# Patient Record
Sex: Male | Born: 1960 | Race: Asian | Hispanic: No | Marital: Married | Smoking: Never smoker
Health system: Southern US, Community
[De-identification: ages and names within clinical notes are randomized; demographics above are authoritative.]

## PROBLEM LIST (undated history)

## (undated) DIAGNOSIS — E785 Hyperlipidemia, unspecified: Secondary | ICD-10-CM

## (undated) DIAGNOSIS — R7989 Other specified abnormal findings of blood chemistry: Secondary | ICD-10-CM

## (undated) DIAGNOSIS — T39395A Adverse effect of other nonsteroidal anti-inflammatory drugs [NSAID], initial encounter: Secondary | ICD-10-CM

## (undated) DIAGNOSIS — I10 Essential (primary) hypertension: Secondary | ICD-10-CM

## (undated) DIAGNOSIS — N200 Calculus of kidney: Secondary | ICD-10-CM

## (undated) HISTORY — DX: Essential (primary) hypertension: I10

## (undated) HISTORY — DX: Hyperlipidemia, unspecified: E78.5

## (undated) HISTORY — DX: Adverse effect of other nonsteroidal anti-inflammatory drugs (NSAID), initial encounter: T39.395A

## (undated) HISTORY — DX: Other specified abnormal findings of blood chemistry: R79.89

## (undated) HISTORY — DX: Calculus of kidney: N20.0

---

## 1999-09-04 ENCOUNTER — Encounter: Payer: Self-pay | Admitting: Emergency Medicine

## 1999-09-04 ENCOUNTER — Emergency Department (HOSPITAL_COMMUNITY): Admission: EM | Admit: 1999-09-04 | Discharge: 1999-09-04 | Payer: Self-pay | Admitting: Emergency Medicine

## 2000-03-05 ENCOUNTER — Encounter: Admission: RE | Admit: 2000-03-05 | Discharge: 2000-03-05 | Payer: Self-pay | Admitting: Urology

## 2000-03-05 ENCOUNTER — Encounter: Payer: Self-pay | Admitting: Urology

## 2000-03-11 ENCOUNTER — Encounter: Payer: Self-pay | Admitting: Urology

## 2000-03-11 ENCOUNTER — Encounter: Admission: RE | Admit: 2000-03-11 | Discharge: 2000-03-11 | Payer: Self-pay | Admitting: Urology

## 2000-03-19 ENCOUNTER — Encounter: Payer: Self-pay | Admitting: Urology

## 2000-03-19 ENCOUNTER — Encounter: Admission: RE | Admit: 2000-03-19 | Discharge: 2000-03-19 | Payer: Self-pay | Admitting: Urology

## 2000-11-13 ENCOUNTER — Encounter: Payer: Self-pay | Admitting: Family Medicine

## 2001-03-13 ENCOUNTER — Encounter: Payer: Self-pay | Admitting: Family Medicine

## 2001-03-13 LAB — CONVERTED CEMR LAB: Hgb A1c MFr Bld: 8 %

## 2001-09-13 ENCOUNTER — Encounter: Payer: Self-pay | Admitting: Family Medicine

## 2002-04-14 ENCOUNTER — Encounter: Payer: Self-pay | Admitting: Family Medicine

## 2002-09-14 ENCOUNTER — Encounter: Payer: Self-pay | Admitting: Family Medicine

## 2002-09-14 LAB — CONVERTED CEMR LAB: Hgb A1c MFr Bld: 7.5 %

## 2002-12-14 ENCOUNTER — Encounter: Payer: Self-pay | Admitting: Family Medicine

## 2002-12-14 LAB — CONVERTED CEMR LAB: Hgb A1c MFr Bld: 5.8 %

## 2003-08-14 ENCOUNTER — Encounter: Payer: Self-pay | Admitting: Family Medicine

## 2004-03-13 ENCOUNTER — Encounter: Payer: Self-pay | Admitting: Family Medicine

## 2004-03-13 LAB — CONVERTED CEMR LAB: Hgb A1c MFr Bld: 8.3 %

## 2004-04-01 ENCOUNTER — Ambulatory Visit: Payer: Self-pay | Admitting: Family Medicine

## 2004-04-03 ENCOUNTER — Ambulatory Visit: Payer: Self-pay | Admitting: Family Medicine

## 2004-04-10 ENCOUNTER — Emergency Department (HOSPITAL_COMMUNITY): Admission: EM | Admit: 2004-04-10 | Discharge: 2004-04-10 | Payer: Self-pay | Admitting: Emergency Medicine

## 2004-10-07 ENCOUNTER — Ambulatory Visit: Payer: Self-pay | Admitting: Family Medicine

## 2004-12-13 ENCOUNTER — Encounter: Payer: Self-pay | Admitting: Family Medicine

## 2004-12-13 LAB — CONVERTED CEMR LAB
Hgb A1c MFr Bld: 8.2 %
Microalbumin U total vol: 2.2 mg/L

## 2004-12-31 ENCOUNTER — Ambulatory Visit: Payer: Self-pay | Admitting: Family Medicine

## 2004-12-31 LAB — CONVERTED CEMR LAB: Microalbumin U total vol: 2.2 mg/L

## 2005-01-02 ENCOUNTER — Ambulatory Visit: Payer: Self-pay | Admitting: Family Medicine

## 2006-04-15 ENCOUNTER — Ambulatory Visit: Payer: Self-pay | Admitting: Family Medicine

## 2006-08-03 ENCOUNTER — Ambulatory Visit: Payer: Self-pay | Admitting: Family Medicine

## 2006-08-04 DIAGNOSIS — I10 Essential (primary) hypertension: Secondary | ICD-10-CM

## 2006-08-04 DIAGNOSIS — M109 Gout, unspecified: Secondary | ICD-10-CM | POA: Insufficient documentation

## 2006-08-04 DIAGNOSIS — E1149 Type 2 diabetes mellitus with other diabetic neurological complication: Secondary | ICD-10-CM

## 2006-08-05 ENCOUNTER — Ambulatory Visit: Payer: Self-pay | Admitting: Family Medicine

## 2006-08-05 DIAGNOSIS — R1013 Epigastric pain: Secondary | ICD-10-CM

## 2006-08-05 LAB — CONVERTED CEMR LAB
BUN: 9 mg/dL (ref 6–23)
Bilirubin, Direct: 0.1 mg/dL (ref 0.0–0.3)
CO2: 29 meq/L (ref 19–32)
Creatinine,U: 240.4 mg/dL
GFR calc Af Amer: 94 mL/min
Glucose, Bld: 211 mg/dL — ABNORMAL HIGH (ref 70–99)
Microalb Creat Ratio: 7.1 mg/g (ref 0.0–30.0)
Microalb, Ur: 1.7 mg/dL (ref 0.0–1.9)
Potassium: 4.4 meq/L (ref 3.5–5.1)
Sodium: 143 meq/L (ref 135–145)
Total CHOL/HDL Ratio: 5
Total Protein: 7.3 g/dL (ref 6.0–8.3)
Triglycerides: 197 mg/dL — ABNORMAL HIGH (ref 0–149)

## 2006-08-21 ENCOUNTER — Ambulatory Visit: Payer: Self-pay | Admitting: Family Medicine

## 2006-08-21 LAB — CONVERTED CEMR LAB
Ketones, urine, test strip: NEGATIVE
Nitrite: NEGATIVE

## 2006-09-08 ENCOUNTER — Ambulatory Visit: Payer: Self-pay | Admitting: Gastroenterology

## 2006-09-08 LAB — CONVERTED CEMR LAB
Basophils Absolute: 0 10*3/uL (ref 0.0–0.1)
Eosinophils Absolute: 0.3 10*3/uL (ref 0.0–0.6)
Eosinophils Relative: 4.4 % (ref 0.0–5.0)
HCT: 35.6 % — ABNORMAL LOW (ref 39.0–52.0)
Hemoglobin: 12.3 g/dL — ABNORMAL LOW (ref 13.0–17.0)
MCHC: 34.5 g/dL (ref 30.0–36.0)
MCV: 85 fL (ref 78.0–100.0)
Monocytes Absolute: 0.4 10*3/uL (ref 0.2–0.7)
Neutrophils Relative %: 59.4 % (ref 43.0–77.0)
RDW: 12 % (ref 11.5–14.6)
WBC: 6.8 10*3/uL (ref 4.5–10.5)

## 2006-09-18 ENCOUNTER — Ambulatory Visit: Payer: Self-pay | Admitting: Gastroenterology

## 2006-09-18 ENCOUNTER — Encounter: Payer: Self-pay | Admitting: Family Medicine

## 2006-10-27 ENCOUNTER — Ambulatory Visit: Payer: Self-pay | Admitting: Gastroenterology

## 2006-10-27 LAB — CONVERTED CEMR LAB
Basophils Absolute: 0 10*3/uL (ref 0.0–0.1)
Basophils Relative: 0.3 % (ref 0.0–1.0)
Lymphocytes Relative: 28 % (ref 12.0–46.0)
MCHC: 34.6 g/dL (ref 30.0–36.0)
Monocytes Relative: 7.1 % (ref 3.0–11.0)
Neutro Abs: 4.1 10*3/uL (ref 1.4–7.7)
Platelets: 288 10*3/uL (ref 150–400)

## 2007-03-18 DIAGNOSIS — K219 Gastro-esophageal reflux disease without esophagitis: Secondary | ICD-10-CM | POA: Insufficient documentation

## 2007-12-23 ENCOUNTER — Ambulatory Visit: Payer: Self-pay | Admitting: Family Medicine

## 2009-03-12 ENCOUNTER — Telehealth (INDEPENDENT_AMBULATORY_CARE_PROVIDER_SITE_OTHER): Payer: Self-pay | Admitting: *Deleted

## 2009-03-16 ENCOUNTER — Ambulatory Visit: Payer: Self-pay | Admitting: Family Medicine

## 2009-03-16 LAB — CONVERTED CEMR LAB
AST: 33 units/L (ref 0–37)
Albumin: 3.7 g/dL (ref 3.5–5.2)
BUN: 10 mg/dL (ref 6–23)
Basophils Absolute: 0 10*3/uL (ref 0.0–0.1)
CO2: 30 meq/L (ref 19–32)
Calcium: 9.4 mg/dL (ref 8.4–10.5)
Chloride: 103 meq/L (ref 96–112)
Cholesterol: 169 mg/dL (ref 0–200)
Creatinine, Ser: 1.2 mg/dL (ref 0.4–1.5)
HCT: 43.5 % (ref 39.0–52.0)
Hemoglobin: 14.8 g/dL (ref 13.0–17.0)
Hgb A1c MFr Bld: 12.6 % — ABNORMAL HIGH (ref 4.6–6.5)
Lymphs Abs: 2.5 10*3/uL (ref 0.7–4.0)
MCHC: 33.9 g/dL (ref 30.0–36.0)
MCV: 88.6 fL (ref 78.0–100.0)
Microalb Creat Ratio: 94.7 mg/g — ABNORMAL HIGH (ref 0.0–30.0)
Microalb, Ur: 0.9 mg/dL (ref 0.0–1.9)
Monocytes Absolute: 0.6 10*3/uL (ref 0.1–1.0)
Neutro Abs: 3.7 10*3/uL (ref 1.4–7.7)
Platelets: 237 10*3/uL (ref 150.0–400.0)
RDW: 11.9 % (ref 11.5–14.6)
TSH: 2.36 microintl units/mL (ref 0.35–5.50)
Total Bilirubin: 0.6 mg/dL (ref 0.3–1.2)
Total CHOL/HDL Ratio: 4
Triglycerides: 383 mg/dL — ABNORMAL HIGH (ref 0.0–149.0)
Uric Acid, Serum: 8.1 mg/dL — ABNORMAL HIGH (ref 4.0–7.8)

## 2009-03-21 ENCOUNTER — Ambulatory Visit: Payer: Self-pay | Admitting: Family Medicine

## 2009-04-18 ENCOUNTER — Encounter (INDEPENDENT_AMBULATORY_CARE_PROVIDER_SITE_OTHER): Payer: Self-pay | Admitting: *Deleted

## 2009-04-18 ENCOUNTER — Ambulatory Visit: Payer: Self-pay | Admitting: Gastroenterology

## 2009-04-18 DIAGNOSIS — R197 Diarrhea, unspecified: Secondary | ICD-10-CM

## 2009-04-25 ENCOUNTER — Ambulatory Visit: Payer: Self-pay | Admitting: Gastroenterology

## 2009-04-25 LAB — CONVERTED CEMR LAB
ALT: 34 units/L (ref 0–53)
Albumin: 3.4 g/dL — ABNORMAL LOW (ref 3.5–5.2)
Alkaline Phosphatase: 46 units/L (ref 39–117)
Bilirubin, Direct: 0.1 mg/dL (ref 0.0–0.3)
Total Protein: 7.2 g/dL (ref 6.0–8.3)

## 2009-04-25 LAB — HM COLONOSCOPY: HM Colonoscopy: NORMAL

## 2009-04-30 ENCOUNTER — Encounter: Payer: Self-pay | Admitting: Gastroenterology

## 2009-06-27 ENCOUNTER — Ambulatory Visit: Payer: Self-pay | Admitting: Family Medicine

## 2009-06-27 LAB — CONVERTED CEMR LAB
AST: 24 units/L (ref 0–37)
Hgb A1c MFr Bld: 10.3 % — ABNORMAL HIGH (ref 4.6–6.5)

## 2009-07-04 ENCOUNTER — Ambulatory Visit: Payer: Self-pay | Admitting: Family Medicine

## 2009-08-20 ENCOUNTER — Encounter (INDEPENDENT_AMBULATORY_CARE_PROVIDER_SITE_OTHER): Payer: Self-pay | Admitting: *Deleted

## 2010-02-14 NOTE — Letter (Signed)
Summary: Diabetic Instructions  [facility]  [address] [place], [place] [address]   Phone: [phone]  Fax: [phone]    Patrick Watts February 12, 1960 MRN: [id number]   X   ORAL DIABETIC MEDICATION INSTRUCTIONS  The day before your procedure:   Take your diabetic pill as you do normally  The day of your procedure:   Do not take your diabetic pill    We will check your blood sugar levels during the admission process and again in Recovery before discharging you home  ________________________________________________________________________

## 2010-02-14 NOTE — Progress Notes (Signed)
Summary: needs refill on lisinopril   Phone Note Call from Patient Call back at [phone]   Caller: Patient Call For: Shaune Leeks MD Summary of Call: Patient called requesting refill on Lisinopril. He needs office visit before getting another refill. Scheduled app for 03-21-09. He wants to know if he can get refill to get him through until then sent to [facility] on [address].  Initial call taken by: Melody Comas,  March 12, 2009 2:04 PM    Prescriptions: LISINOPRIL 20 MG TABS (LISINOPRIL) Take 1 tablet by mouth once a day  #30 Each x 0   Entered and Authorized by:   Shaune Leeks MD   Signed by:   Shaune Leeks MD on 03/12/2009   Method used:   Electronically to        [facility] (retail)       [address]       [place], [place]  [address]       Ph: [phone]       Fax: [phone]   RxID:   [id number]

## 2010-02-14 NOTE — Letter (Signed)
Summary: Results Letter  [facility]  [address] [place], [place] [address]   Phone: [phone]  Fax: [phone]        April 30, 2009 MRN: [id number]    MYLEZ VENABLE [address] [place], [place]  [address]    Dear Mr. ZERINGUE,   The biopsies taken during your recent colonoscopy were all normal.  You should continue to follow the recommnedations that we discussed at the time of your procedure.  You should continue to follow current colorectal cancer screening guidelines with a repeat colonoscopy in 10 years.  We will therefore put your information in our reminder system and will contact you in 10 years to schedule a repeat procedure.  Please call if you have any questions or concerns.       Sincerely,  Rachael Fee MD  This letter has been electronically signed by your physician.  Appended Document: Results Letter letter mailed 4.20.11.

## 2010-02-14 NOTE — Assessment & Plan Note (Signed)
Summary: CPX/ AET[employer]  CYD   Vital Signs:  Patient profile:   50 year old male Height:      67.5 inches Weight:      207.50 pounds BMI:     32.14 Temp:     98.8 degrees F oral Pulse rate:   84 / minute Pulse rhythm:   regular BP sitting:   118 / 80  (left arm) Cuff size:   regular  Vitals Entered By: Sydell Axon LPN (March 21, 1476 10:56 AM) CC: 30 Minute checkup   History of Present Illness: Pt last seen 12/09, now here for Comp Exam. He has stomach problems occas, 2-3 days out of the weeks. He had been seen in the past by Dr Christella Hartigan and was told to use Prilosec at night. He has lots of complaints and changes in BMs...he requests reeval, so will send back to GI. He is also tired, not every day...Marland Kitchenhe also doesn't have any these above sxs every day, they really wax and wane. He does have days when things are nml.  Preventive Screening-Counseling & Management  Alcohol-Tobacco     Alcohol drinks/day: 0     Smoking Status: never     Passive Smoke Exposure: no  Caffeine-Diet-Exercise     Caffeine use/day: 1 tea a day.     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 Take 1 Tablet By Mouth Once A Day 2)  Metformin Hcl 850 Mg  Tabs (Metformin Hcl) .... Take One By Mouth Three Times A Day 3)  Glimepiride 4 Mg  Tabs (Glimepiride) .... Take One By Mouth  Twice A Day. 4)  Pravachol 40 Mg  Tabs (Pravastatin Sodium) .... 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 (03/21/2009) Exercise: yes (03/21/2009)  Risk Factors: Smoking Status: never (03/21/2009) Passive Smoke Exposure: no (03/21/2009)  Family History: Father: dec 77 Renal Failure CHF coronary disease, CABG x 3 Mother: dec  diabetes and hypertension, kidney dz, CHF Brother A 98 DM  Sister  A 50Kidney stones Sister A 96  Social History: Caffeine use/day:  1 tea a day. Does Patient Exercise:  yes  Review of Systems General:  Complains of sweats; denies chills, fatigue, fever, weakness, and weight loss; at night.Marland Kitchen  Eyes:  Denies blurring, discharge, and eye pain. ENT:  Denies decreased hearing, ear discharge, earache, and ringing in ears. CV:  Denies chest pain or discomfort, fainting, fatigue, palpitations, shortness of breath with exertion, swelling of feet, and swelling of hands. Resp:  Denies cough, shortness of breath, and wheezing. GI:  Complains of abdominal pain, bloody stools, and diarrhea; denies change in bowel habits, constipation, dark tarry stools, indigestion, loss of appetite, nausea, vomiting, vomiting blood, and yellowish skin color; off and on, see HPI. GU:  Denies discharge, dysuria, nocturia, and urinary frequency. MS:  Denies joint pain, low back pain, muscle aches, cramps, and  stiffness. Derm:  Denies dryness, itching, and rash. Neuro:  Denies numbness, poor balance, tingling, and tremors.  Physical Exam  General:  Well-developed,well-nourished,in no acute distress; alert,appropriate and cooperative throughout examination, mildly obese. Head:  Normocephalic and atraumatic without obvious abnormalities. No apparent alopecia or balding. Eyes:  Conjunctiva clear bilaterally.  Ears:  External ear exam shows no significant lesions or deformities.  Otoscopic examination reveals clear canals, tympanic membranes are intact bilaterally without bulging, retraction, inflammation or discharge. Hearing is grossly normal bilaterally. Nose:  External nasal examination shows no deformity or inflammation. Nasal mucosa are pink and moist without lesions or exudates. Mouth:  Oral mucosa and oropharynx without lesions or exudates.  Teeth in good repair. Neck:  No deformities, masses, or tenderness noted. Chest Wall:  No deformities, masses, tenderness or gynecomastia noted. Breasts:  No masses or gynecomastia noted Lungs:  Normal respiratory effort, chest expands symmetrically. Lungs are clear to auscultation, no crackles or wheezes. Heart:  Normal rate and regular rhythm. S1 and S2 normal without gallop, murmur, click, rub or other extra sounds. Abdomen:  Bowel sounds positive,abdomen soft and non-tender without masses, organomegaly or hernias noted. No pathology noted today. Rectal:  No external abnormalities noted. Normal sphincter tone. No rectal masses or tenderness. G neg. Genitalia:  Testes bilaterally descended without nodularity, tenderness or masses. No scrotal masses or lesions. No penis lesions or urethral discharge. No tenderness to palpation in the right testicular or spermatic chord area. no hernia noted. Prostate:  Prostate gland firm and smooth, no enlargement, nodularity, tenderness, mass, asymmetry or induration.30-40gms Msk:  No deformity or scoliosis noted of  thoracic or lumbar spine.   Pulses:  R and L carotid,radial,femoral,dorsalis pedis and posterior tibial pulses are full and equal bilaterally Extremities:  No clubbing, cyanosis, edema, or deformity noted with normal full range of motion of all joints.   Neurologic:  No cranial nerve deficits noted. Station and gait are normal. Sensory, motor and coordinative functions appear intact. Skin:  Intact without suspicious lesions or rashes Cervical Nodes:  No lymphadenopathy noted Inguinal Nodes:  No significant adenopathy Psych:  Cognition and judgment appear intact. Alert and cooperative with normal attention span and concentration. No apparent delusions, illusions, hallucinations  Diabetes Management Exam:    Foot Exam (with socks and/or shoes not present):       Sensory-Pinprick/Light touch:          Left medial foot (L-4): normal          Left dorsal foot (L-5): normal          Left lateral foot (S-1): normal          Right medial foot (L-4): normal          Right dorsal foot (L-5): normal          Right lateral foot (S-1): normal  Sensory-Monofilament:          Left foot: normal          Right foot: normal       Inspection:          Left foot: normal          Right foot: normal       Nails:          Left foot: normal          Right foot: normal    Impression & Recommendations:  Problem # 1:  HEALTH MAINTENANCE EXAM (ICD-V70.0) Assessment Comment Only  Problem # 2:  GERD (ICD-530.81) Assessment: Deteriorated  ?Gastroparesis? Will refer back to GI for eval. Orders: Gastroenterology Referral (GI)  Problem # 3:  SYMPTOM, PAIN, ABDOMINAL, EPIGASTRIC (ICD-789.06) Assessment: Deteriorated See above. Start Citrucel to try to regulate GI tract better.  Problem # 4:  RENAL CALCULUS, RECURRENT 08/01 (ICD-592.0) Assessment: Unchanged Stable, none recently.  Problem # 5:  AODM (ICD-250.00) Assessment: Deteriorated Poor control, readily admits to complete noncompliance.  Will intensify medications with starting Actos. 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 Merely need to institute it! Could be reason for abd pains through diab gastroparesis. Discussed at length. His updated medication list for this problem includes:    Lisinopril 20 Mg Tabs (Lisinopril) .Marland Kitchen... Take 1 tablet by mouth once a day    Metformin Hcl 850 Mg Tabs (Metformin hcl) .Marland Kitchen... Take one by mouth three times a day    Glimepiride 4 Mg Tabs (Glimepiride) .Marland Kitchen... Take one by mouth twice a day.    Actos 45 Mg Tabs (Pioglitazone hcl) ..... One tab by mouth once daily  Labs Reviewed: Creat: 1.2 (03/16/2009)   Microalbumin: 2.2 (12/31/2004) Reviewed HgBA1c results: 12.6 (03/16/2009)  9.9 (08/03/2006)  Problem # 6:  GOUT NOS (ICD-274.9) Assessment: Unchanged  Uses Colchicine routinely. Does not want to start Allopurinol. Will send  in script.  His updated medication list for this problem includes:    Colcrys 0.6 Mg Tabs (Colchicine) ..... One tab by mouth two times a day as needed gouty symptoms as needed.  Problem # 7:  HYPERLIPIDEMIA TRIG NEC/NOS (ICD-272.4) Assessment: Improved Adequate. Cont meds and watch fatty food intake. His updated medication list for this problem includes:    Pravachol 40 Mg Tabs (Pravastatin sodium) .Marland Kitchen... Take 2 by mouth at bedtime  Labs Reviewed: SGOT: 33 (03/16/2009)   SGPT: 32 (03/16/2009)   HDL:44.70 (03/16/2009), 30.6 (08/03/2006)  LDL:83 (08/03/2006)  Chol:169 (03/16/2009), 153 (08/03/2006)  Trig:383.0 (03/16/2009), 197 (08/03/2006)  Problem # 8:  HYPERTENSION, BENIGN ESSENTIAL (ICD-401.1) Assessment: Improved Stable with good control. Cont curr regimen. His updated medication list for this problem includes:    Lisinopril 20 Mg Tabs (Lisinopril) .Marland Kitchen... Take 1 tablet by mouth once a day  BP today: 118/80 Prior BP: 130/80 (12/23/2007)  Labs Reviewed: K+: 4.5 (03/16/2009) Creat: : 1.2 (03/16/2009)   Chol: 169 (03/16/2009)   HDL: 44.70  (03/16/2009)   LDL: 83 (08/03/2006)   TG: 383.0 (03/16/2009)  Complete Medication List: 1)  Lisinopril 20 Mg Tabs (Lisinopril) .... Take 1 tablet by mouth once a day 2)  Metformin Hcl 850 Mg Tabs (Metformin hcl) .... Take one by mouth three times a day 3)  Glimepiride 4 Mg Tabs (Glimepiride) .... Take one by mouth twice a day. 4)  Pravachol 40 Mg Tabs (Pravastatin sodium) .... Take 2 by mouth at bedtime 5)  Actos 45 Mg Tabs (Pioglitazone hcl) .... One tab  by mouth once daily 6)  Colcrys 0.6 Mg Tabs (Colchicine) .... One tab by mouth two times a day as needed gouty symptoms as needed.  Patient Instructions: 1)  Refer to GI. 2)  RTC for SGOT, SGPT 6 weeks, again in 3 mos with A1C 250.00, see me then. 3)  Switch Prilosec to 45 mins before brfst.  4)  Start on Citrucel 1 tsp in 8 oz of water every AM.  Prescriptions: COLCRYS 0.6 MG TABS (COLCHICINE) one tab by mouth two times a day as needed gouty symptoms as needed.  #60 x 6   Entered and Authorized by:   Shaune Leeks MD   Signed by:   Shaune Leeks MD on 03/22/2009   Method used:   Electronically to        [facility] (retail)       [address]       [place], [place]  [address]       Ph: [phone]       Fax: [phone]   RxID:   [id number] PRAVACHOL 40 MG  TABS (PRAVASTATIN SODIUM) Take 2 by mouth at bedtime  #60 x 12   Entered and Authorized by:   Shaune Leeks MD   Signed by:   Shaune Leeks MD on 03/21/2009   Method used:   Electronically to        [facility] (retail)       [address]       [place], [place]  [address]       Ph: [phone]       Fax: [phone]   RxID:   [id number] GLIMEPIRIDE 4 MG  TABS (GLIMEPIRIDE) Take one by mouth twice a day.  #60 Each x 12   Entered and Authorized by:   Shaune Leeks MD   Signed by:   Shaune Leeks MD on 03/21/2009   Method used:   Electronically to         [facility] (retail)       [address]       [place], [place]  [address]       Ph: [phone]       Fax: [phone]   RxID:   [id number] METFORMIN HCL 850 MG  TABS (METFORMIN HCL) Take one by mouth three times a day  #90 x 12   Entered and Authorized by:   Shaune Leeks MD   Signed by:   Shaune Leeks MD on 03/21/2009   Method used:   Electronically to        [facility] (retail)       [address]       [place], [place]  [address]       Ph: [phone]       Fax: [phone]   RxID:   [id number] LISINOPRIL 20 MG TABS (LISINOPRIL) Take 1 tablet by mouth once a day  #30 Each x 12   Entered and Authorized by:   Shaune Leeks MD   Signed by:   Shaune Leeks MD on 03/21/2009   Method used:   Electronically to        [facility] (retail)  [address]       [place], [place]  [address]       Ph: [phone]       Fax: [phone]   RxID:   [id number] ACTOS 45 MG TABS (PIOGLITAZONE HCL) one tab by mouth once daily  #30 x 12   Entered and Authorized by:   Shaune Leeks MD   Signed by:   Shaune Leeks MD on 03/21/2009   Method used:   Electronically to        [facility] (retail)       [address]       [place], [place]  [address]       Ph: [phone]       Fax: [phone]   RxID:   [id number]   Current Allergies (reviewed today): No known allergies    Influenza Immunization History:    Influenza # 1:  Historical (01/19/2009) Received vaccine per patient at work

## 2010-02-14 NOTE — Assessment & Plan Note (Signed)
Summary: 3 m f/u dlo   Vital Signs:  Patient profile:   50 year old male Weight:      223.12 pounds Temp:     98.4 degrees F oral Pulse rate:   76 / minute Pulse rhythm:   regular BP sitting:   130 / 98  (left arm) Cuff size:   large  Vitals Entered By: Sydell Axon LPN (July 04, 2009 7:59 AM) CC: 3 Month follow-up, ? poison ivy on arms   History of Present Illness: Pt here for recheck. He has been on Actois 45 since last visit. He had one dsay of ankle swelling and has not had another episode since. He also has had increased hunger which is still going on. This has improved but continues today. Otherwise tolerating Actos well. He has had colonoscopy and feels much better.  He has rash on his medial forearms thst he thinks is poison ivy...has not extended but bothers him.  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 Take 1 Tablet By Mouth Once A Day 2)  Metformin Hcl 850 Mg  Tabs (Metformin Hcl) .... Take One By Mouth Three Times A Day 3)  Glimepiride 4 Mg  Tabs (Glimepiride) .... Take One By Mouth Twice A Day. 4)  Pravachol 40 Mg  Tabs (Pravastatin Sodium) .... Take 2 By Mouth At Bedtime 5)  Actos 45 Mg Tabs (Pioglitazone Hcl) .... One Tab By Mouth Once Daily 6)  Colcrys 0.6 Mg Tabs (Colchicine) .... One Tab By Mouth Two Times A Day As Needed Gouty Symptoms As Needed. 7)  Prilosec Otc 20 Mg Tbec (Omeprazole Magnesium) .... Take One By Mouth Twice Daily 8)  Pepcid 20 Mg Tabs (Famotidine) .... 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 (03/21/2009) Exercise: yes (03/21/2009)  Risk Factors: Smoking Status: never (03/21/2009) Passive Smoke Exposure: no (03/21/2009)  Physical Exam  General:  Well-developed,well-nourished,in no acute distress; alert,appropriate and cooperative throughout examination, mildly obese. Head:  Normocephalic and atraumatic without obvious abnormalities. No apparent alopecia or balding. Eyes:  Conjunctiva clear bilaterally.  Ears:  External ear exam shows no significant lesions or deformities.  Otoscopic examination reveals clear canals, tympanic membranes are intact bilaterally without bulging, retraction, inflammation or discharge. Hearing is grossly normal bilaterally. Nose:  External nasal examination shows no deformity or inflammation. Nasal mucosa are pink and moist without lesions or exudates. Mouth:  Oral  mucosa and oropharynx without lesions or exudates.  Teeth in good repair. Neck:  No deformities, masses, or tenderness noted. Chest Wall:  No deformities, masses, tenderness or gynecomastia noted. Lungs:  Normal respiratory effort, chest expands symmetrically. Lungs are clear to auscultation, no crackles or wheezes. Heart:  Normal rate and regular rhythm. S1 and S2 normal without gallop, murmur, click, rub or other extra  sounds.   Impression & Recommendations:  Problem # 1:  DIARRHEA, ACUTE (ICD-787.91) Assessment Improved His prep is probably more the reason for his improved sxs than anything else. May need occas "cleanout " of his bowels to preserve his bowel health. Discussed symptom control and diet. Call if worsening of symptoms or signs of dehydration.   Problem # 2:  Family Hx of POLYP, COLON (ICD-211.3) Assessment: Unchanged Had no evidence of polyps himself with colonoscopy. Will be rescoped in ten years.  Problem # 3:  SYMPTOM, PAIN, ABDOMINAL, EPIGASTRIC (ICD-789.06) Assessment: Improved Resolved.  Problem # 4:  AODM (ICD-250.00) Assessment: Improved  Better but still a fair ways to go. Pt working hard at controlling appetite. Acvtos is still continuing to kick in in my opinion. Continue curr therapy. He was given printout of A1Cs as motivation. His updated medication list for this problem includes:    Lisinopril 20 Mg Tabs (Lisinopril) .Marland Kitchen... Take 1 tablet by mouth once a day    Metformin Hcl 850 Mg Tabs (Metformin hcl) .Marland Kitchen... Take one by mouth three times a day    Glimepiride 4 Mg Tabs (Glimepiride) .Marland Kitchen... Take one by mouth twice a day.    Actos 45 Mg Tabs (Pioglitazone hcl) ..... One tab by mouth once daily  Labs Reviewed: Creat: 1.2 (03/16/2009)   Microalbumin: 2.2 (12/31/2004) Reviewed HgBA1c results: 10.3 (06/27/2009)  12.6 (03/16/2009)  Problem # 5:  HYPERTENSION, BENIGN ESSENTIAL (ICD-401.1) Assessment: Unchanged Probably nerves today. Pressures have been great in the recent past. Recheck in the future. He has the ability to get rechecked at work.  His updated medication list for this problem includes:    Lisinopril 20 Mg Tabs (Lisinopril) .Marland Kitchen... Take 1 tablet by mouth once a day  BP today: 130/98 Prior BP: 126/78 (04/18/2009)  Labs Reviewed: K+: 4.5 (03/16/2009) Creat: : 1.2 (03/16/2009)   Chol: 169 (03/16/2009)   HDL: 44.70 (03/16/2009)   LDL: 83 (08/03/2006)   TG: 383.0  (03/16/2009)  Complete Medication List: 1)  Lisinopril 20 Mg Tabs (Lisinopril) .... Take 1 tablet by mouth once a day 2)  Metformin Hcl 850 Mg Tabs (Metformin hcl) .... Take one by mouth three times a day 3)  Glimepiride 4 Mg Tabs (Glimepiride) .... Take one by mouth twice a day. 4)  Pravachol 40 Mg Tabs (Pravastatin sodium) .... Take 2 by mouth at bedtime 5)  Actos 45 Mg Tabs (Pioglitazone hcl) .... One tab by mouth once daily 6)  Colcrys 0.6 Mg Tabs (Colchicine) .... One tab by mouth two times a day as needed gouty symptoms as needed. 7)  Prilosec Otc 20 Mg Tbec (Omeprazole magnesium) .... Take one by mouth twice daily 8)  Pepcid 20 Mg Tabs (Famotidine) .... Take one by mouth once daily 9)  Clobetasol Propionate 0.05 % Oint (Clobetasol propionate) .... Apply to arms two times a day  Patient Instructions: 1)  RTC 6 mos, sooner as needed. Prescriptions: CLOBETASOL PROPIONATE 0.05 % OINT (CLOBETASOL PROPIONATE) apply to arms two times a day  #30 gms x 0   Entered and Authorized by:   Shaune Leeks MD  Signed by:   Shaune Leeks MD on 07/04/2009   Method used:   Electronically to        [facility] (retail)       [address]       [place], [place]  [address]       Ph: [phone]       Fax: [phone]   RxID:   [id number]   Current Allergies (reviewed today): No known allergies

## 2010-02-14 NOTE — Letter (Signed)
Summary: Patrick Watts letter  [place] [facility]  [address] [place], [place] [address]   Phone: [phone]  Fax: [phone]       08/20/2009 MRN: [id number]  [name] [address] [place], [place]  [address]  Dear Mr. Patrick [facility] [place], and [facility] announce the retirement of Patrick Silence, M.D., from full-time practice at the [facility] office effective July 12, 2009 and his plans of returning part-time.  It is important to Dr. Hetty Watts and to our practice that you understand that [facility] has seven physicians in our office for your health care needs.  We will continue to offer the same exceptional care that you have today.    Dr. Hetty Watts has spoken to many of you about his plans for retirement and returning part-time in the fall.   We will continue to work with you through the transition to schedule appointments for you in the office and meet the high standards that [place] is committed to.   Again, it is with great pleasure that we share the news that Dr. Hetty Watts will return to [facility] in October of 2011 with a reduced schedule.    If you have any questions, or would like to request an appointment with one of our physicians, please call us at [phone] and press the option for Scheduling an appointment.  We take pleasure in providing you with excellent patient care and look forward to seeing you at your next office visit.  Our [facility] Physicians are:  Patrick Watts, M.D. Patrick Watts, M.D. Patrick Watts, M.D. Patrick Watts, M.D. Patrick Watts, M.D. Patrick Watts, M.D. We proudly welcomed Patrick Watts, M.D. and Patrick Watts, M.D. to the practice in July/August 2011.  Sincerely,  [facility]

## 2010-02-14 NOTE — Assessment & Plan Note (Signed)
GASTROINTESTINAL PROBLEMS: 1. Gastroesophageal reflux disease, nonerosive.  Reflux symptoms improved on once daily Prilosec.  EGD September 18, 2006, was normal 2. Family history of colon polyps.   History of Present Illness Visit Type: Follow-up Consult Primary GI MD: Rob Bunting MD Primary Provider: Laurita Quint, MD Requesting Provider: Laurita Quint, MD Chief Complaint: Genella Rife History of Present Illness:     very pleasant 50 year old man whom I last saw about 2-1/2 years ago.  he is having intermittent nausea, sour taste in mouth, loose bowels.  this occurs about one week every month.  No blood in stool.  I recommended a colonoscopy at last visit, he declined. He has loose stool expecially after eating. He has intentionally lost 20 pounds.   Stool can change colors (brown to light brown, to green).  He takes prilosec 20mg  every AM, usually about 30 min  before breakfast.   (OTC meds).  Takes pepcid before bedtime.  No dypshagia.  No overt GI bleeding. NO NSAIDs.  he has been diabetic for 6 years.  recent labs show that he is not anemic, his hemoglobin A1c was 12-1/2.           Current Medications (verified): 1)  Lisinopril 20 Mg Tabs (Lisinopril) .... Take 1 Tablet By Mouth Once A Day 2)  Metformin Hcl 850 Mg  Tabs (Metformin Hcl) .... Take One By Mouth Three Times A Day 3)  Glimepiride 4 Mg  Tabs (Glimepiride) .... Take One By Mouth Twice A Day. 4)  Pravachol 40 Mg  Tabs (Pravastatin Sodium) .... Take 2 By Mouth At Bedtime 5)  Actos 45 Mg Tabs (Pioglitazone Hcl) .... One Tab By Mouth Once Daily 6)  Colcrys 0.6 Mg Tabs (Colchicine) .... One Tab By Mouth Two Times A Day As Needed Gouty Symptoms As Needed. 7)  Prilosec Otc 20 Mg Tbec (Omeprazole Magnesium) .... Take One By Mouth Once Daily 8)  Pepcid 20 Mg Tabs (Famotidine) .... Take One By Mouth Once Daily  Allergies (verified): No Known Drug Allergies  Social History: Marital Status: Married Children: 2 Occupation:  [employer] drinks 2-3 caffinated bevs a day non-smoker  Vital Signs:  Patient profile:   50 year old male Height:      67.5 inches Weight:      215.4 pounds BMI:     33.36 Pulse rate:   80 / minute Pulse rhythm:   regular BP sitting:   126 / 78  (left arm) Cuff size:   regular  Vitals Entered By: Harlow Mares CMA Duncan Dull) (April 18, 2009 10:01 AM)  Physical Exam  Additional Exam:  Constitutional: generally well appearing Psychiatric: alert and oriented times 3 Abdomen: soft, non-tender, non-distended, normal bowel sounds    Impression & Recommendations:  Problem # 1:  nausea, intermittent acid symptoms, GERD his hemoglobin A1c last month was 12 a half. I have suspected that he has diabetic gastroparesis in the past and at this chronic blood sugar level he almost certainly does. I recommended he start eating smaller more frequent meals, we will double his proton pump inhibitor. I do not think repeat EGD is reasonable this point.  Problem # 2:  abnormal stool color he mentioned this to me 2 years ago and given this fact and the fact that he has colon polyps and his family I recommended colonoscopy. He did not schedule that procedure however now he wants to go ahead and do so. We will arrange for him to be done at his soonest  convenience.  Patient Instructions: 1)  You will be scheduled to have a colonoscopy. 2)  Try to control blood sugars as best as possible, this will help your stomach funtion normally. 3)  Try smaller, more frequent meals (5 meals a day instead of 3). 4)  For now, double the prilosec (20mg  pill before breakfast and dinner meals). 5)  A copy of this information will be sent to Dr. Hetty Ely. 6)  The medication list was reviewed and reconciled.  All changed / newly prescribed medications were explained.  A complete medication list was provided to the patient / caregiver. Prescriptions: PRILOSEC OTC 20 MG TBEC (OMEPRAZOLE MAGNESIUM) take one  by mouth twice daily  #60 x 11   Entered and Authorized by:   Rachael Fee MD   Signed by:   Rachael Fee MD on 04/18/2009   Method used:   Electronically to        [facility] (retail)       [address]       [place], [place]  [address]       Ph: [phone]       Fax: [phone]   RxID:   [id number]   Appended Document: Orders Update/movi    Clinical Lists Changes  Problems: Added new problem of DIARRHEA, ACUTE (ICD-787.91) Medications: Added new medication of MOVIPREP 100 GM  SOLR (PEG-KCL-NACL-NASULF-NA ASC-C) As per prep instructions. - Signed Rx of MOVIPREP 100 GM  SOLR (PEG-KCL-NACL-NASULF-NA ASC-C) As per prep instructions.;  #1 x 0;  Signed;  Entered by: Chales Abrahams CMA (AAMA);  Authorized by: Rachael Fee MD;  Method used: Electronically to [facility] [address], [place], [place], [place]  [address], Ph: [phone], Fax: [phone] Orders: Added new Test order of Colonoscopy (Colon) - Signed    Prescriptions: MOVIPREP 100 GM  SOLR (PEG-KCL-NACL-NASULF-NA ASC-C) As per prep instructions.  #1 x 0   Entered by:   Chales Abrahams CMA (AAMA)   Authorized by:   Rachael Fee MD   Signed by:   Chales Abrahams CMA (AAMA) on 04/18/2009   Method used:   Electronically to        [facility] (retail)       [address]       [place], [place]  [address]       Ph: [phone]       Fax: [phone]   RxID:   [id number]

## 2010-02-14 NOTE — Letter (Signed)
Summary: [facility] Instructions  [facility]  [address] [place], [place] [address]   Phone: [phone]  Fax: [phone]       Patrick Watts    12/13/1960    MRN: [id number]        Procedure Day /Date:04/25/09  WED     Arrival Time:830 am     Procedure Time:930 am     Location of Procedure:                    X  [facility] (4th Floor)                        PREPARATION FOR COLONOSCOPY WITH MOVIPREP   Starting 5 days prior to your procedure 4/8/11do not eat nuts, seeds, popcorn, corn, beans, peas,  salads, or any raw vegetables.  Do not take any fiber supplements (e.g. Metamucil, Citrucel, and Benefiber).  THE DAY BEFORE YOUR PROCEDURE         DATE: 04/24/09  DAY: TUE  1.  Drink clear liquids the entire day-NO SOLID FOOD  2.  Do not drink anything colored red or purple.  Avoid juices with pulp.  No orange juice.  3.  Drink at least 64 oz. (8 glasses) of fluid/clear liquids during the day to prevent dehydration and help the prep work efficiently.  CLEAR LIQUIDS INCLUDE: Water Jello Ice Popsicles Tea (sugar ok, no milk/cream) Powdered fruit flavored drinks Coffee (sugar ok, no milk/cream) Gatorade Juice: apple, white grape, white cranberry  Lemonade Clear bullion, consomm, broth Carbonated beverages (any kind) Strained chicken noodle soup Hard Candy                             4.  In the morning, mix first dose of MoviPrep solution:    Empty 1 Pouch A and 1 Pouch B into the disposable container    Add lukewarm drinking water to the top line of the container. Mix to dissolve    Refrigerate (mixed solution should be used within 24 hrs)  5.  Begin drinking the prep at 5:00 p.m. The MoviPrep container is divided by 4 marks.   Every 15 minutes drink the solution down to the next mark (approximately 8 oz) until the full liter is complete.   6.  Follow completed prep with 16 oz of clear liquid of your choice (Nothing red or purple).   Continue to drink clear liquids until bedtime.  7.  Before going to bed, mix second dose of MoviPrep solution:    Empty 1 Pouch A and 1 Pouch B into the disposable container    Add lukewarm drinking water to the top line of the container. Mix to dissolve    Refrigerate  THE DAY OF YOUR PROCEDURE      DATE: 04/25/09 DAY: WED  Beginning at430 a.m. (5 hours before procedure):         1. Every 15 minutes, drink the solution down to the next mark (approx 8 oz) until the full liter is complete.  2. Follow completed prep with 16 oz. of clear liquid of your choice.    3. You may drink clear liquids until 730 am (2 HOURS BEFORE PROCEDURE).   MEDICATION INSTRUCTIONS  Unless otherwise instructed, you should take regular prescription medications with a small sip of water   as early as possible the morning of your procedure.  Diabetic patients -  see separate instructions.           OTHER INSTRUCTIONS  You will need a responsible adult at least 49 years of age to accompany you and drive you home.   This person must remain in the waiting room during your procedure.  Wear loose fitting clothing that is easily removed.  Leave jewelry and other valuables at home.  However, you may wish to bring a book to read or  an iPod/MP3 player to listen to music as you wait for your procedure to start.  Remove all body piercing jewelry and leave at home.  Total time from sign-in until discharge is approximately 2-3 hours.  You should go home directly after your procedure and rest.  You can resume normal activities the  day after your procedure.  The day of your procedure you should not:   Drive   Make legal decisions   Operate machinery   Drink alcohol   Return to work  You will receive specific instructions about eating, activities and medications before you leave.    The above instructions have been reviewed and explained to me by   _______________________    I fully  understand and can verbalize these instructions _____________________________ Date _________

## 2010-02-14 NOTE — Procedures (Signed)
Summary: Colonoscopy  Patient: Patrick Watts Note: All result statuses are Final unless otherwise noted.  Tests: (1) Colonoscopy (COL)   COL Colonoscopy           DONE     [facility]     [address]     [place], [place]  [address]           COLONOSCOPY PROCEDURE REPORT           PATIENT:  Patrick, Watts  MR#:  [id number]     BIRTHDATE:  March 24, 1960, 48 yrs. old  GENDER:  male     ENDOSCOPIST:  Rachael Fee, MD           PROCEDURE DATE:  04/25/2009     PROCEDURE:  Colonoscopy with biopsy     ASA CLASS:  Class II     INDICATIONS:  intermittent diarrhea     MEDICATIONS:   Fentanyl 75 mcg IV, Versed 8 mg IV           DESCRIPTION OF PROCEDURE:   After the risks benefits and     alternatives of the procedure were thoroughly explained, informed     consent was obtained.  Digital rectal exam was performed and     revealed no rectal masses.   The LB [id number]-[id number] endoscope     was introduced through the anus and advanced to the cecum, which     was identified by both the appendix and ileocecal valve, without     limitations.  The quality of the prep was good, using MoviPrep.     The instrument was then slowly withdrawn as the colon was fully     examined.     <<PROCEDUREIMAGES>>     FINDINGS:  A normal appearing cecum, ileocecal valve, and     appendiceal orifice were identified. The ascending, hepatic     flexure, transverse, splenic flexure, descending, sigmoid colon,     and rectum appeared unremarkable (see image1, image3, and image4).     Random biopsies were taken from colon  (sent to pathology, jar 1).     Retroflexed views in the rectum revealed no abnormalities.    The     scope was then withdrawn from the patient and the procedure     completed.           COMPLICATIONS:  None           ENDOSCOPIC IMPRESSION:     1) Normal colom; mucosa was randomly biopsied to check for     microscopic colitis           RECOMMENDATIONS:     1) Continue current  colorectal screening recommendations for     "routine risk" patients with a repeat colonoscopy in 10 years.     2) Await final biopsies.           REPEAT EXAM:  10 years           ______________________________     Rachael Fee, MD           cc: Laurita Quint, MD           n.     eSIGNED:   Rachael Fee at 04/25/2009 09:41 AM           [place] Qua, [id number]  Note: An exclamation mark (!) indicates a result that was not dispersed into the flowsheet. Document Creation Date: 04/25/2009 9:42 AM _______________________________________________________________________  (1) Order result  status: Final Collection or observation date-time: 04/25/2009 09:35 Requested date-time:  Receipt date-time:  Reported date-time:  Referring Physician:   Ordering Physician: Rob Bunting [phone]) Specimen Source:  Source: Launa Grill Order Number: [id number] Lab site:   Appended Document: Colonoscopy patty, can you put him in for LFTs today (just had colonsocopy).  His PCP wants them today.  Appended Document: Colonoscopy    Clinical Lists Changes  Orders: Added new Test order of TLB-Hepatic/Liver Function Pnl (80076-HEPATIC) - Signed      Appended Document: Colonoscopy labs have been entered.  The pt can go to the lab when he is released from the [facility].  Appended Document: Colonoscopy     Procedures Next Due Date:    Colonoscopy: 04/2019  Appended Document: Colonoscopy     Clinical Lists Changes  Observations: Added new observation of PAST SURG HX: Passing of recurrent kidney stones   ER 1996  08/1999 EGD nml (Dr Christella Hartigan)  09/18/2006 Colonoscopy Nml w/ nml biopsies (Dr Christella Hartigan) 04/25/2009 (05/01/2009 16:28)       Past Surgical History:    Passing of recurrent kidney stones   ER 1996  08/1999    EGD nml (Dr Christella Hartigan)  09/18/2006    Colonoscopy Nml w/ nml biopsies (Dr Christella Hartigan) 04/25/2009

## 2010-04-03 LAB — GLUCOSE, CAPILLARY
Glucose-Capillary: 249 mg/dL — ABNORMAL HIGH (ref 70–99)
Glucose-Capillary: 250 mg/dL — ABNORMAL HIGH (ref 70–99)

## 2010-04-17 ENCOUNTER — Other Ambulatory Visit: Payer: Self-pay | Admitting: Family Medicine

## 2010-05-03 ENCOUNTER — Telehealth: Payer: Self-pay | Admitting: *Deleted

## 2010-05-03 NOTE — Telephone Encounter (Signed)
Pt is coming in for physical on 5/9 and would like his A1C checked prior.  OK to order?

## 2010-05-03 NOTE — Telephone Encounter (Signed)
Yes, may order A1C, 250.00

## 2010-05-09 NOTE — Telephone Encounter (Signed)
Lab appt made

## 2010-05-15 ENCOUNTER — Ambulatory Visit: Payer: Self-pay | Admitting: Family Medicine

## 2010-05-17 ENCOUNTER — Other Ambulatory Visit: Payer: Self-pay | Admitting: Family Medicine

## 2010-05-17 DIAGNOSIS — E119 Type 2 diabetes mellitus without complications: Secondary | ICD-10-CM

## 2010-05-18 ENCOUNTER — Encounter: Payer: Self-pay | Admitting: Family Medicine

## 2010-05-20 ENCOUNTER — Telehealth: Payer: Self-pay | Admitting: *Deleted

## 2010-05-20 ENCOUNTER — Other Ambulatory Visit: Payer: Self-pay | Admitting: *Deleted

## 2010-05-20 ENCOUNTER — Other Ambulatory Visit (INDEPENDENT_AMBULATORY_CARE_PROVIDER_SITE_OTHER): Payer: Managed Care, Other (non HMO) | Admitting: Family Medicine

## 2010-05-20 DIAGNOSIS — E119 Type 2 diabetes mellitus without complications: Secondary | ICD-10-CM

## 2010-05-20 MED ORDER — LISINOPRIL 20 MG PO TABS: 20.0000 mg | ORAL_TABLET | ORAL | Status: DC

## 2010-05-20 NOTE — Telephone Encounter (Signed)
Rx already refilled.

## 2010-05-22 ENCOUNTER — Ambulatory Visit: Payer: Self-pay | Admitting: Family Medicine

## 2010-05-23 ENCOUNTER — Encounter: Payer: Self-pay | Admitting: Family Medicine

## 2010-05-23 ENCOUNTER — Ambulatory Visit (INDEPENDENT_AMBULATORY_CARE_PROVIDER_SITE_OTHER): Payer: Managed Care, Other (non HMO) | Admitting: Family Medicine

## 2010-05-23 DIAGNOSIS — E119 Type 2 diabetes mellitus without complications: Secondary | ICD-10-CM

## 2010-05-23 DIAGNOSIS — I10 Essential (primary) hypertension: Secondary | ICD-10-CM

## 2010-05-23 DIAGNOSIS — E785 Hyperlipidemia, unspecified: Secondary | ICD-10-CM

## 2010-05-23 MED ORDER — GLIMEPIRIDE 4 MG PO TABS: 4.0000 mg | ORAL_TABLET | Freq: Every day | ORAL | Status: DC

## 2010-05-23 MED ORDER — PIOGLITAZONE HCL 45 MG PO TABS: 45.0000 mg | ORAL_TABLET | Freq: Every day | ORAL | Status: DC

## 2010-05-23 MED ORDER — METFORMIN HCL 850 MG PO TABS: 850.0000 mg | ORAL_TABLET | Freq: Three times a day (TID) | ORAL | Status: DC

## 2010-05-23 MED ORDER — PRAVASTATIN SODIUM 40 MG PO TABS: 40.0000 mg | ORAL_TABLET | Freq: Every day | ORAL | Status: DC

## 2010-05-23 NOTE — Assessment & Plan Note (Signed)
Incr Metformin to tid. Lab Results  Component Value Date   HGBA1C 10.2* 05/20/2010

## 2010-05-23 NOTE — Assessment & Plan Note (Signed)
High the last visit and today. Incr Lisinopril.

## 2010-05-23 NOTE — Progress Notes (Signed)
  Subjective:    Patient ID: Patrick Watts, male    DOB: 1960-05-10, 50 y.o.   MRN: [id number]  HPI Pt here for prescription refill. JHe feels well and has no complaints. His A1C has not gone down as hoped. 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 He is looking at property in [place] across from the old [address].     Review of Systems  Constitutional: Negative for fever, chills, diaphoresis, activity change, appetite change and fatigue.  HENT: Negative for hearing loss, ear pain, congestion, sore throat, rhinorrhea, neck pain, neck stiffness, postnasal drip, sinus pressure, tinnitus and ear discharge.   Eyes: Negative for pain, discharge and visual disturbance.  Respiratory: Negative for cough, shortness of breath and wheezing.   Cardiovascular: Negative for chest pain and palpitations.       [No SOB w/ exertion Gastrointestinal:       [No heartburn or swallowing problems. Genitourinary:       [No nocturia Skin:       [No itching or dryness. Neurological:       [No tingling or balance problems. [all other systems reviewed and are negative       Objective:   Physical Exam  Constitutional: He appears well-developed and well-nourished. No distress.  HENT:  Head: Normocephalic and atraumatic.  Right Ear: External ear normal.  Left Ear: External ear normal.  Nose: Nose normal.  Mouth/Throat: Oropharynx is clear and moist.  Eyes: Conjunctivae and EOM are normal. Pupils are equal, round, and reactive to light. Right eye exhibits no discharge. Left eye exhibits no discharge.  Neck: Normal range of motion. Neck supple.  Cardiovascular: Normal rate and regular rhythm.   Pulmonary/Chest: Effort normal and breath sounds normal. He has no wheezes.  Lymphadenopathy:    He has no cervical adenopathy.  Skin: He is not diaphoretic.          Assessment & Plan:

## 2010-05-23 NOTE — Assessment & Plan Note (Signed)
Cont Prava, curr dose.

## 2010-05-23 NOTE — Patient Instructions (Signed)
RTC 3 mos for Comp Exam labs prior. Incr Metformin 3 times a day. Try to start exercising.

## 2010-05-28 NOTE — Assessment & Plan Note (Signed)
[facility]                         GASTROENTEROLOGY OFFICE NOTE   NAME:Patrick Watts, Patrick Watts                    MRN:          [id number]  DATE:09/08/2006                            DOB:          17-Dec-1960    REASON FOR REFERAL:  Dr. Hetty Ely asked me to evaluate Patrick Watts in  consultation regarding chronic nausea, GERD symptoms.   HISTORY OF PRESENT ILLNESS:  Patrick Watts is a very pleasant 50 year old  man who has nausea and intermittent GERD symptoms over the past several  weeks or so. He said he had a week of nausea approximately 2 months ago.  This is the nausea that usually accompanies his kidney stones. He did  not however have his usual dramatic abdominal pains. The nausea seemed  to improve after taking OTC Pepcid. He saw Dr. Hetty Ely and he was  changed to OTC Prilosec. He took that for a month everyday and that  definitely helped. He tapered down to once every other day and continued  to help. However, more recently he has been having an acid taste in his  mouth and some burning in his chest. He is back up to once daily  Prilosec that he takes after he drinks juice in the morning and about an  hour to two before his breakfast meal.   REVIEW OF SYSTEMS:  Notable for no dysphagia, no overt GI bleeding,  stable weight, it is otherwise essentially normal and is available on  his nursing intake sheet.   PAST MEDICAL HISTORY:  Hypertension, diabetes, elevated cholesterol.   CURRENT MEDICATIONS:  1. Glucophage 850 twice daily.  2. Amaryl.  3. __________ .  4. Lisinopril.  5. Prilosec once daily.   ALLERGIES:  No known drug allergies.   SOCIAL HISTORY:  Married with 2 children, works as a Teacher, early years/pre. Non  smoker, non drinker. He drinks at least 3 caffeinated beverages a day.  He eats peppermint rarely.   FAMILY HISTORY:  No colon cancer in family. Colon polyps do run in his  family. Diabetes runs in his family.   PHYSICAL EXAMINATION:   He is 5 feet 8 inches, 226 pounds, blood pressure  144/94, pulse 80.  CONSTITUTIONALLY: Generally well-appearing.  NEUROLOGICAL: Alert and oriented x3.  EYES: Extraocular movements intact.  MOUTH: Oropharynx moist with no lesions.  NECK: Supple. No lymphadenopathy.  CARDIOVASCULAR: HEART: Regular rate and rhythm.  LUNGS: Clear to auscultation bilaterally.  ABDOMEN: Soft, nontender, nondistended. Normal bowel sounds.  EXTREMITIES: No lower extremity edema.  SKIN: No rashes or lesions on visible extremities.   ASSESSMENT/PLAN:  A 50 year old man with nausea, gastroesophageal reflux  disease symptoms.   He will get a CBC to check to see if he is anemic. I doubt that to be  the case based on the way he looks clinically. He has had recent liver  tests and those were normal. He had a hemoglobin A1C that was quite  elevated at 9.9. Diabetic gastroparesis can cause nausea and can also  contribute to gastroesophageal reflux disease symptoms and so I  recommended as tight of control as possible could be helpful. He is  taking Prilosec but not at the correct time in relationship to food and  so he will change it to taking it 20 to 30 minutes prior to his lunch  meal on a daily basis. I also recommended he cut back on his caffeine  and I have given him a gastroesophageal reflux disease handout. He will  be arranged for an EGD to be performed at his soonest convenience.     Rachael Fee, MD  Electronically Signed    DPJ/MedQ  DD: 09/08/2006  DT: 09/08/2006  Job #: [id number]   cc:   Arta Silence, MD

## 2010-05-28 NOTE — Assessment & Plan Note (Signed)
[facility]                         GASTROENTEROLOGY OFFICE NOTE   NAME:Gruenhagen, DONTA FUSTER                    MRN:          [id number]  DATE:10/27/2006                            DOB:          Sep 26, 1960    PRIMARY CARE PHYSICIAN:  Arta Silence, MD   GASTROINTESTINAL PROBLEMS:  1. Gastroesophageal reflux disease, nonerosive.  Reflux symptoms      improved on once daily Prilosec.  EGD September 18, 2006, was      normal.  2. Family history of colon polyps.   INTERVAL HISTORY:  I last saw Seger at the time of his upper endoscopy  about a month ago.  Since then, his nausea has been under fairly good  control, although, he did have an episode of nausea that lasted for  probably 4-5 days.  He had no vomiting at this time.  There is also  concern that his stools have been unusual colors, very dark green,  sometimes very pale.  He has had no abdominal pains.   CURRENT MEDICATIONS:  1. Glucophage.  2. Amaryl.  3. Mevacor.  4. Lisinopril.  5. Prilosec.   PHYSICAL EXAMINATION:  VITAL SIGNS:  Weight 225 pounds, blood pressure  138/88, pulse 70.  CONSTITUTIONAL:  Generally well-appearing.  ABDOMEN:  Soft, nontender, nondistended, normal bowel sounds.  EXTREMITIES:  No lower extremity edema.   ASSESSMENT/PLAN:  A 50 year old man with GERD, family history of colon  polyps, mild anemia.   He had a CBC a month ago and it was hemoglobin of 12.3.  With this in  mind and the fact that he has colon polyps running in the family and  that his stools have been unusual color, I think we should proceed with  full colonoscopy at his soonest convenience.  He did have a wave of  nausea that last several days.  This seems consistent with diabetic  gastroparesis.  I will actually  arrange for him to have a gastric emptying scan performed to confirm  this.  Lastly, he will get a repeat CBC today.     Rachael Fee, MD  Electronically Signed    DPJ/MedQ  DD: 10/27/2006  DT: 10/28/2006  Job #: [id number]   cc:   Arta Silence, MD

## 2010-06-22 ENCOUNTER — Other Ambulatory Visit: Payer: Self-pay | Admitting: Family Medicine

## 2010-06-24 ENCOUNTER — Other Ambulatory Visit: Payer: Self-pay | Admitting: *Deleted

## 2010-06-24 MED ORDER — LISINOPRIL 20 MG PO TABS: 20.0000 mg | ORAL_TABLET | Freq: Every day | ORAL | Status: DC

## 2010-06-24 MED ORDER — GLIMEPIRIDE 4 MG PO TABS: ORAL_TABLET | ORAL | Status: DC

## 2010-06-24 MED ORDER — PRAVASTATIN SODIUM 40 MG PO TABS: ORAL_TABLET | ORAL | Status: DC

## 2010-07-02 ENCOUNTER — Telehealth: Payer: Self-pay | Admitting: *Deleted

## 2010-07-02 MED ORDER — LISINOPRIL 20 MG PO TABS: ORAL_TABLET | ORAL | Status: DC

## 2010-07-02 NOTE — Telephone Encounter (Signed)
Going to twice a day is fine. Thank you.

## 2010-07-02 NOTE — Telephone Encounter (Signed)
Pt states dose was increased to twice a day at his last office visit, but didn't get put into chart.

## 2010-08-13 ENCOUNTER — Other Ambulatory Visit: Payer: Self-pay | Admitting: Family Medicine

## 2010-08-13 DIAGNOSIS — E785 Hyperlipidemia, unspecified: Secondary | ICD-10-CM

## 2010-08-13 DIAGNOSIS — R197 Diarrhea, unspecified: Secondary | ICD-10-CM

## 2010-08-13 DIAGNOSIS — E119 Type 2 diabetes mellitus without complications: Secondary | ICD-10-CM

## 2010-08-13 DIAGNOSIS — M109 Gout, unspecified: Secondary | ICD-10-CM

## 2010-08-21 ENCOUNTER — Other Ambulatory Visit: Payer: Managed Care, Other (non HMO)

## 2010-08-28 ENCOUNTER — Encounter: Payer: Managed Care, Other (non HMO) | Admitting: Family Medicine

## 2010-12-31 ENCOUNTER — Other Ambulatory Visit: Payer: Self-pay | Admitting: Family Medicine

## 2010-12-31 NOTE — Telephone Encounter (Signed)
Received refill request electronically from pharmacy. Is it okay to refill medication? 

## 2010-12-31 NOTE — Telephone Encounter (Signed)
Pt was supposed to have had an ppt for PE in Aug. He should not be on Colchicine all the time. Will refill for 60 tabs. Have pt seen after the New Year by Dr Para March as get acquainted and to discuss this and then get a PE scheduled.

## 2011-01-01 NOTE — Telephone Encounter (Signed)
Patient notified as instructed by telephone. Patient transferred to scheduler to set up appointment with Dr. Para March.

## 2011-02-11 ENCOUNTER — Ambulatory Visit (INDEPENDENT_AMBULATORY_CARE_PROVIDER_SITE_OTHER): Payer: Managed Care, Other (non HMO) | Admitting: Family Medicine

## 2011-02-11 ENCOUNTER — Encounter: Payer: Self-pay | Admitting: Family Medicine

## 2011-02-11 DIAGNOSIS — E119 Type 2 diabetes mellitus without complications: Secondary | ICD-10-CM

## 2011-02-11 DIAGNOSIS — E1149 Type 2 diabetes mellitus with other diabetic neurological complication: Secondary | ICD-10-CM

## 2011-02-11 DIAGNOSIS — E785 Hyperlipidemia, unspecified: Secondary | ICD-10-CM

## 2011-02-11 DIAGNOSIS — E1142 Type 2 diabetes mellitus with diabetic polyneuropathy: Secondary | ICD-10-CM

## 2011-02-11 DIAGNOSIS — I10 Essential (primary) hypertension: Secondary | ICD-10-CM

## 2011-02-11 DIAGNOSIS — E669 Obesity, unspecified: Secondary | ICD-10-CM

## 2011-02-11 DIAGNOSIS — M109 Gout, unspecified: Secondary | ICD-10-CM

## 2011-02-11 NOTE — Progress Notes (Signed)
Diabetes:  Using medications without difficulties:yes Hypoglycemic episodes:no Hyperglycemic episodes: no Feet problems: recent gout flare, now with burning in feet Blood Sugars averaging: ~220 in AM  Hypertension:    Using medication without problems or lightheadedness: yes Chest pain with exertion:no Edema:no Short of breath:no  Elevated Cholesterol: Using medications without problems:yes Muscle aches: no Diet compliance:yes, started in 1/13 Exercise: limited by gout  PMH and SH reviewed  Meds, vitals, and allergies reviewed.   ROS: See HPI.  Otherwise negative.    GEN: nad, alert and oriented, overweight HEENT: mucous membranes moist NECK: supple w/o LA CV: rrr. PULM: ctab, no inc wob ABD: soft, +bs EXT: no edema SKIN: no acute rash  Diabetic foot exam: Normal inspection except for mild erythema at R1st MTP No skin breakdown No calluses  Normal DP pulses Normal sensation to light touch and monofilament Nails normal

## 2011-02-11 NOTE — Patient Instructions (Signed)
Come back for fasting labs.  You can get your results through our phone system.  Follow the instructions on the blue card. Plan to recheck A1c in 3 months with an OV a few days after the labs.   Keep working on your diet and weight. Take care.

## 2011-02-12 ENCOUNTER — Encounter: Payer: Self-pay | Admitting: Family Medicine

## 2011-02-12 DIAGNOSIS — E669 Obesity, unspecified: Secondary | ICD-10-CM | POA: Insufficient documentation

## 2011-02-12 NOTE — Assessment & Plan Note (Signed)
D/w pt about weight loss.  

## 2011-02-12 NOTE — Assessment & Plan Note (Signed)
Now with sx of neuropathy, intermittently, during hyperglycemia.  Working on diet and weight.  Will follow. He is now committed.  Will likely need insulin w/o sig changes.  D/w pt.  Recheck a1c 3 months.  >25 min spent with face to face with patient, >50% counseling and/or coordinating care.

## 2011-02-12 NOTE — Assessment & Plan Note (Signed)
Continue meds, work on weight, return for labs.

## 2011-02-12 NOTE — Assessment & Plan Note (Signed)
Continue statin, work on weight, return for labs.

## 2011-02-12 NOTE — Assessment & Plan Note (Signed)
List of foods to avoid d/w pt; work on weight, consider allopurinol depending on labs.  He agrees.

## 2011-02-18 ENCOUNTER — Other Ambulatory Visit (INDEPENDENT_AMBULATORY_CARE_PROVIDER_SITE_OTHER): Payer: Managed Care, Other (non HMO)

## 2011-02-18 DIAGNOSIS — E119 Type 2 diabetes mellitus without complications: Secondary | ICD-10-CM

## 2011-02-18 DIAGNOSIS — M109 Gout, unspecified: Secondary | ICD-10-CM

## 2011-02-18 LAB — COMPREHENSIVE METABOLIC PANEL
AST: 35 U/L (ref 0–37)
Albumin: 3.7 g/dL (ref 3.5–5.2)
Alkaline Phosphatase: 61 U/L (ref 39–117)
BUN: 11 mg/dL (ref 6–23)
Creatinine, Ser: 1.3 mg/dL (ref 0.4–1.5)
Potassium: 4.6 mEq/L (ref 3.5–5.1)
Total Bilirubin: 0.5 mg/dL (ref 0.3–1.2)

## 2011-02-18 LAB — HEMOGLOBIN A1C: Hgb A1c MFr Bld: 11.9 % — ABNORMAL HIGH (ref 4.6–6.5)

## 2011-02-18 LAB — LIPID PANEL
HDL: 40.5 mg/dL (ref >39.00)
Triglycerides: 287 mg/dL — ABNORMAL HIGH (ref 0.0–149.0)
VLDL: 57.4 mg/dL — ABNORMAL HIGH (ref 0.0–40.0)

## 2011-02-18 LAB — URIC ACID: Uric Acid, Serum: 7 mg/dL (ref 4.0–7.8)

## 2011-02-19 LAB — LDL CHOLESTEROL, DIRECT: Direct LDL: 102.8 mg/dL

## 2011-03-19 ENCOUNTER — Telehealth: Payer: Self-pay | Admitting: *Deleted

## 2011-03-19 MED ORDER — ALLOPURINOL 300 MG PO TABS: 300.0000 mg | ORAL_TABLET | Freq: Every day | ORAL | Status: DC

## 2011-03-19 NOTE — Telephone Encounter (Signed)
Patient called stating that he has had several flares with gout and Dr. Para March told him to call back if he has gout flares and he would sent in a Rx.  Patient is requesting Allopurinol 300mg  be sent to [facility].  Patient is aware that Dr. Para March will not be in the office until this afternoon.

## 2011-03-19 NOTE — Telephone Encounter (Signed)
Patient notified as instructed by telephone. 

## 2011-03-19 NOTE — Telephone Encounter (Signed)
Pt can't start the allopurinol until he is 2 weeks past the resolution of his last gout flare.  If he does sooner, he'll make it worse.  rx sent.

## 2011-05-05 ENCOUNTER — Other Ambulatory Visit: Payer: Managed Care, Other (non HMO)

## 2011-05-12 ENCOUNTER — Ambulatory Visit: Payer: Managed Care, Other (non HMO) | Admitting: Family Medicine

## 2011-05-14 ENCOUNTER — Other Ambulatory Visit: Payer: Self-pay | Admitting: Family Medicine

## 2011-06-16 ENCOUNTER — Other Ambulatory Visit: Payer: Self-pay | Admitting: Family Medicine

## 2011-06-28 ENCOUNTER — Other Ambulatory Visit: Payer: Self-pay | Admitting: Family Medicine

## 2011-08-14 ENCOUNTER — Other Ambulatory Visit: Payer: Self-pay | Admitting: Family Medicine

## 2011-08-14 DIAGNOSIS — E119 Type 2 diabetes mellitus without complications: Secondary | ICD-10-CM

## 2011-08-14 NOTE — Telephone Encounter (Signed)
Electronic refill request

## 2011-08-15 NOTE — Telephone Encounter (Signed)
LMOVM of home and cell phone. 

## 2011-08-15 NOTE — Telephone Encounter (Signed)
He was due for recheck A1c months ago.  Needs OV and labs set up.  rx sent.

## 2011-08-18 ENCOUNTER — Other Ambulatory Visit: Payer: Self-pay | Admitting: Family Medicine

## 2011-10-10 ENCOUNTER — Other Ambulatory Visit (INDEPENDENT_AMBULATORY_CARE_PROVIDER_SITE_OTHER): Payer: Managed Care, Other (non HMO)

## 2011-10-10 DIAGNOSIS — E119 Type 2 diabetes mellitus without complications: Secondary | ICD-10-CM

## 2011-10-10 LAB — HEMOGLOBIN A1C: Hgb A1c MFr Bld: 9.8 % — ABNORMAL HIGH (ref 4.6–6.5)

## 2011-10-17 ENCOUNTER — Ambulatory Visit (INDEPENDENT_AMBULATORY_CARE_PROVIDER_SITE_OTHER): Payer: Managed Care, Other (non HMO) | Admitting: Family Medicine

## 2011-10-17 ENCOUNTER — Encounter: Payer: Self-pay | Admitting: Family Medicine

## 2011-10-17 ENCOUNTER — Other Ambulatory Visit: Payer: Self-pay | Admitting: Family Medicine

## 2011-10-17 VITALS — BP 142/86 | HR 77 | Temp 98.2°F | Wt 218.0 lb

## 2011-10-17 DIAGNOSIS — E1142 Type 2 diabetes mellitus with diabetic polyneuropathy: Secondary | ICD-10-CM

## 2011-10-17 DIAGNOSIS — M109 Gout, unspecified: Secondary | ICD-10-CM

## 2011-10-17 DIAGNOSIS — E119 Type 2 diabetes mellitus without complications: Secondary | ICD-10-CM

## 2011-10-17 DIAGNOSIS — E1149 Type 2 diabetes mellitus with other diabetic neurological complication: Secondary | ICD-10-CM

## 2011-10-17 MED ORDER — LISINOPRIL 20 MG PO TABS: 20.0000 mg | ORAL_TABLET | Freq: Two times a day (BID) | ORAL | Status: DC

## 2011-10-17 NOTE — Patient Instructions (Signed)
Stop the allopurinol, take the metformin 3 times a day and recheck A1c in 3 months at a lab visit.  We'll be in touch after that.  Take care.  Glad to see you. I would get a flu shot each fall.

## 2011-10-17 NOTE — Assessment & Plan Note (Signed)
Stop allopurinol and continue to work on weight.

## 2011-10-17 NOTE — Assessment & Plan Note (Signed)
Will get flu shot at work.  Some improvement in A1c but no controlled.  He wants to continue work on diet, inc metformin to tid and then recheck A1c in 3 months.  This is reasonable.  If not much better, would need consideration of insulin.  He wants to avoid insulin.

## 2011-10-17 NOTE — Progress Notes (Signed)
Gout- inc in sx with allopurinol.  Using colchicine abortively.  We discussed.  See plan.    Diabetes:  Using medications without difficulties:yes Hypoglycemic episodes:no Hyperglycemic episodes:yes Feet problems: losing L 1st nail after a gout flare.  Blood Sugars averaging: 200 or higher A1c improved but still uncontrolled.  Labs d/w pt.    He is covered up with work, family, church.  Discussed working on diet and weight, exercise.  Gout had limited exercise prev.   Meds, vitals, and allergies reviewed.   ROS: See HPI.  Otherwise negative.    GEN: nad, alert and oriented HEENT: mucous membranes moist NECK: supple w/o LA CV: rrr. PULM: ctab, no inc wob ABD: soft, +bs EXT: no edema SKIN: no acute rash  Diabetic foot exam: Normal inspection except for lose- then removed- L 1st nail No skin breakdown No calluses  Normal DP pulses Normal sensation to light touch and monofilament- improved from prev.  Nails normal

## 2011-10-31 ENCOUNTER — Other Ambulatory Visit: Payer: Self-pay | Admitting: *Deleted

## 2011-10-31 MED ORDER — GLIMEPIRIDE 4 MG PO TABS: 4.0000 mg | ORAL_TABLET | Freq: Two times a day (BID) | ORAL | Status: DC

## 2011-12-05 ENCOUNTER — Other Ambulatory Visit: Payer: Self-pay | Admitting: *Deleted

## 2011-12-05 MED ORDER — ALLOPURINOL 300 MG PO TABS: 300.0000 mg | ORAL_TABLET | Freq: Every day | ORAL | Status: DC

## 2011-12-05 MED ORDER — METFORMIN HCL 850 MG PO TABS: ORAL_TABLET | ORAL | Status: DC

## 2012-01-21 ENCOUNTER — Other Ambulatory Visit: Payer: Managed Care, Other (non HMO)

## 2012-01-23 ENCOUNTER — Other Ambulatory Visit: Payer: Self-pay | Admitting: *Deleted

## 2012-01-23 MED ORDER — PIOGLITAZONE HCL 45 MG PO TABS: 45.0000 mg | ORAL_TABLET | Freq: Every day | ORAL | Status: DC

## 2012-02-23 ENCOUNTER — Other Ambulatory Visit: Payer: Self-pay | Admitting: Family Medicine

## 2012-04-05 ENCOUNTER — Other Ambulatory Visit: Payer: Self-pay | Admitting: Family Medicine

## 2012-04-05 NOTE — Telephone Encounter (Signed)
Patient was only given 1 month's supply at last RF request.  Please advise.

## 2012-04-05 NOTE — Telephone Encounter (Signed)
He needs to schedule f/u lab visit before this is called in.  If scheduled, then call in #[phone], 12 rf.

## 2012-04-07 ENCOUNTER — Other Ambulatory Visit: Payer: Self-pay | Admitting: Family Medicine

## 2012-04-07 DIAGNOSIS — E119 Type 2 diabetes mellitus without complications: Secondary | ICD-10-CM

## 2012-04-07 DIAGNOSIS — M109 Gout, unspecified: Secondary | ICD-10-CM

## 2012-04-08 ENCOUNTER — Other Ambulatory Visit: Payer: Managed Care, Other (non HMO)

## 2012-04-21 ENCOUNTER — Other Ambulatory Visit: Payer: Managed Care, Other (non HMO)

## 2012-05-06 ENCOUNTER — Other Ambulatory Visit (INDEPENDENT_AMBULATORY_CARE_PROVIDER_SITE_OTHER): Payer: Managed Care, Other (non HMO)

## 2012-05-06 DIAGNOSIS — E119 Type 2 diabetes mellitus without complications: Secondary | ICD-10-CM

## 2012-05-06 DIAGNOSIS — I10 Essential (primary) hypertension: Secondary | ICD-10-CM

## 2012-05-06 DIAGNOSIS — M109 Gout, unspecified: Secondary | ICD-10-CM

## 2012-05-06 LAB — HEMOGLOBIN A1C: Hgb A1c MFr Bld: 10.7 % — ABNORMAL HIGH (ref 4.6–6.5)

## 2012-05-06 LAB — COMPREHENSIVE METABOLIC PANEL
ALT: 19 U/L (ref 0–53)
CO2: 28 mEq/L (ref 19–32)
Calcium: 8.9 mg/dL (ref 8.4–10.5)
Chloride: 103 mEq/L (ref 96–112)
Creatinine, Ser: 1.3 mg/dL (ref 0.4–1.5)
GFR: 63.07 mL/min (ref >60.00)
Glucose, Bld: 179 mg/dL — ABNORMAL HIGH (ref 70–99)
Total Bilirubin: 0.5 mg/dL (ref 0.3–1.2)
Total Protein: 7 g/dL (ref 6.0–8.3)

## 2012-05-06 LAB — LIPID PANEL
HDL: 35.9 mg/dL — ABNORMAL LOW (ref >39.00)
Total CHOL/HDL Ratio: 4
Triglycerides: 129 mg/dL (ref 0.0–149.0)

## 2012-05-06 LAB — URIC ACID: Uric Acid, Serum: 4.6 mg/dL (ref 4.0–7.8)

## 2012-06-21 ENCOUNTER — Ambulatory Visit (INDEPENDENT_AMBULATORY_CARE_PROVIDER_SITE_OTHER): Payer: Managed Care, Other (non HMO) | Admitting: Family Medicine

## 2012-06-21 ENCOUNTER — Encounter: Payer: Self-pay | Admitting: Family Medicine

## 2012-06-21 VITALS — BP 142/88 | HR 98 | Temp 98.4°F | Wt 215.5 lb

## 2012-06-21 DIAGNOSIS — E119 Type 2 diabetes mellitus without complications: Secondary | ICD-10-CM

## 2012-06-21 DIAGNOSIS — J069 Acute upper respiratory infection, unspecified: Secondary | ICD-10-CM

## 2012-06-21 DIAGNOSIS — E1149 Type 2 diabetes mellitus with other diabetic neurological complication: Secondary | ICD-10-CM

## 2012-06-21 DIAGNOSIS — E1142 Type 2 diabetes mellitus with diabetic polyneuropathy: Secondary | ICD-10-CM

## 2012-06-21 MED ORDER — INSULIN GLARGINE 100 UNIT/ML SOLOSTAR PEN: PEN_INJECTOR | SUBCUTANEOUS | Status: DC

## 2012-06-21 MED ORDER — INSULIN PEN NEEDLE 31G X 5 MM MISC: Status: DC

## 2012-06-21 NOTE — Progress Notes (Signed)
Diabetes:  Using medications without difficulties: yes Hypoglycemic episodes:no Hyperglycemic episodes:occ Feet problems:see below on DM foot exam a1 d/w pt.   Recent ST noted.  Some better with ibuprofen.  Improving some overall.  Meds, vitals, and allergies reviewed.   ROS: See HPI.  Otherwise negative.    GEN: nad, alert and oriented HEENT: mucous membranes moist, tm wnl, nasal exam wnl, geographic tongue noted but no thrush.  NECK: supple w/o LA CV: rrr. PULM: ctab, no inc wob ABD: soft, +bs EXT: no edema SKIN: no acute rash  Diabetic foot exam: Normal inspection No skin breakdown No calluses  Normal DP pulses Normal sensation to light touch and monofilament Nails normal except for potentially losing distal portion of L 1st nail

## 2012-06-21 NOTE — Patient Instructions (Addendum)
Start with 5 units a day.  Recheck in about 3 months, labs then a visit.  If you have trouble with coverage, let me know.  Take care.  Glad to see you.

## 2012-06-21 NOTE — Assessment & Plan Note (Signed)
Nontoxic, supportive tx and f/u prn.  Should resolve.

## 2012-06-21 NOTE — Assessment & Plan Note (Signed)
Start lantus with inc in dose based on AM sugars.  D/w pt re:pen use and he agrees, understands.  Continue to work on diet and recheck in ~3 months.  He agrees.

## 2012-06-29 ENCOUNTER — Telehealth: Payer: Self-pay

## 2012-06-29 NOTE — Telephone Encounter (Signed)
Pt left v/m Lantus insulin cost over $300.00 pt request substitution for Levemir to [employer]Please advise.

## 2012-06-30 MED ORDER — INSULIN DETEMIR 100 UNIT/ML FLEXPEN: SUBCUTANEOUS | Status: DC

## 2012-06-30 NOTE — Telephone Encounter (Signed)
Patient called back and wanted to switch pharmacies, rx resent

## 2012-06-30 NOTE — Telephone Encounter (Signed)
Change made, sent.  Thanks.

## 2012-06-30 NOTE — Addendum Note (Signed)
Addended by: Sueanne Margarita on: 06/30/2012 11:38 AM   Modules accepted: Orders

## 2012-06-30 NOTE — Telephone Encounter (Signed)
Left message on VM that rx was changed and sent to the pharmacy

## 2012-07-21 ENCOUNTER — Emergency Department (HOSPITAL_COMMUNITY): Payer: Managed Care, Other (non HMO)

## 2012-07-21 ENCOUNTER — Encounter (HOSPITAL_COMMUNITY): Payer: Self-pay | Admitting: Family Medicine

## 2012-07-21 ENCOUNTER — Ambulatory Visit: Payer: Managed Care, Other (non HMO) | Admitting: Family Medicine

## 2012-07-21 ENCOUNTER — Emergency Department (HOSPITAL_COMMUNITY)
Admission: EM | Admit: 2012-07-21 | Discharge: 2012-07-21 | Disposition: A | Discharge status: Home / Self Care | Payer: Managed Care, Other (non HMO) | Attending: Emergency Medicine | Admitting: Emergency Medicine

## 2012-07-21 DIAGNOSIS — R739 Hyperglycemia, unspecified: Secondary | ICD-10-CM

## 2012-07-21 DIAGNOSIS — E1069 Type 1 diabetes mellitus with other specified complication: Secondary | ICD-10-CM | POA: Insufficient documentation

## 2012-07-21 DIAGNOSIS — E785 Hyperlipidemia, unspecified: Secondary | ICD-10-CM | POA: Insufficient documentation

## 2012-07-21 DIAGNOSIS — I10 Essential (primary) hypertension: Secondary | ICD-10-CM | POA: Insufficient documentation

## 2012-07-21 DIAGNOSIS — M109 Gout, unspecified: Secondary | ICD-10-CM | POA: Insufficient documentation

## 2012-07-21 DIAGNOSIS — Z87442 Personal history of urinary calculi: Secondary | ICD-10-CM | POA: Insufficient documentation

## 2012-07-21 DIAGNOSIS — R110 Nausea: Secondary | ICD-10-CM | POA: Insufficient documentation

## 2012-07-21 DIAGNOSIS — Z794 Long term (current) use of insulin: Secondary | ICD-10-CM | POA: Insufficient documentation

## 2012-07-21 DIAGNOSIS — N200 Calculus of kidney: Secondary | ICD-10-CM | POA: Insufficient documentation

## 2012-07-21 DIAGNOSIS — R109 Unspecified abdominal pain: Secondary | ICD-10-CM

## 2012-07-21 DIAGNOSIS — Z79899 Other long term (current) drug therapy: Secondary | ICD-10-CM | POA: Insufficient documentation

## 2012-07-21 LAB — URINALYSIS, ROUTINE W REFLEX MICROSCOPIC
Glucose, UA: >1000 mg/dL — AB
Ketones, ur: NEGATIVE mg/dL
Leukocytes, UA: NEGATIVE
Protein, ur: NEGATIVE mg/dL

## 2012-07-21 LAB — BASIC METABOLIC PANEL
Chloride: 98 mEq/L (ref 96–112)
Creatinine, Ser: 1.45 mg/dL — ABNORMAL HIGH (ref 0.50–1.35)
GFR calc Af Amer: 64 mL/min — ABNORMAL LOW (ref >90)

## 2012-07-21 LAB — CBC WITH DIFFERENTIAL/PLATELET
Basophils Absolute: 0 10*3/uL (ref 0.0–0.1)
HCT: 38.5 % — ABNORMAL LOW (ref 39.0–52.0)
Lymphocytes Relative: 32 % (ref 12–46)
Monocytes Absolute: 0.6 10*3/uL (ref 0.1–1.0)
Neutro Abs: 4.8 10*3/uL (ref 1.7–7.7)
Neutrophils Relative %: 58 % (ref 43–77)
Platelets: 267 10*3/uL (ref 150–400)
RDW: 12.3 % (ref 11.5–15.5)
WBC: 8.1 10*3/uL (ref 4.0–10.5)

## 2012-07-21 MED ORDER — OXYCODONE-ACETAMINOPHEN 5-325 MG PO TABS: 2.0000 | ORAL_TABLET | ORAL | Status: DC | PRN

## 2012-07-21 MED ORDER — METOCLOPRAMIDE HCL 10 MG PO TABS: 10.0000 mg | ORAL_TABLET | Freq: Four times a day (QID) | ORAL | Status: DC | PRN

## 2012-07-21 MED ORDER — INSULIN ASPART 100 UNIT/ML ~~LOC~~ SOLN
10.0000 [IU] | Freq: Once | SUBCUTANEOUS | Status: AC
  Administered 2012-07-21: 10 [IU] via SUBCUTANEOUS
  Filled 2012-07-21: qty 1

## 2012-07-21 MED ORDER — ONDANSETRON 4 MG PO TBDP
8.0000 mg | ORAL_TABLET | Freq: Once | ORAL | Status: AC
  Administered 2012-07-21: 8 mg via ORAL
  Filled 2012-07-21: qty 2

## 2012-07-21 MED ORDER — ONDANSETRON 8 MG PO TBDP: ORAL_TABLET | ORAL | Status: DC

## 2012-07-21 NOTE — ED Notes (Addendum)
Per pt sts right flank and right lower abdominal pain. sts hx of kidney stone. sts he has been straining his urine and saw some black pieces. Usually he passes them within a few days and this time no relief. Taking motrin with relief but very nauseous.

## 2012-07-21 NOTE — ED Provider Notes (Signed)
History    CSN: [id number] Arrival date & time 07/21/12  1344  First MD Initiated Contact with Patient 07/21/12 1611     Chief Complaint  Patient presents with  . Nephrolithiasis   (Consider location/radiation/quality/duration/timing/severity/associated sxs/prior Treatment) HPI This 52 year old male has a history of kidney stones he states the last several days he has had intermittent waxing and waning of right flank pain it was severe last week it is only minimal today, he had nausea and vomiting last week but only nausea today, he has been passing small flecks of suspected stones but since has several days of persistent intermittent waxing and waning right flank pain wants to know if he has persistent stones or not, he is no fever chest pain cough shortness of breath lightheadedness, he is no vomiting today, he is no grossly bloody urine or scrotal pain or dysuria, he is no abdominal pain, he is no pain to his legs or focal weakness or numbness, there is no treatment prior to arrival, he has been having his diabetes medicines adjusted but has not started Lantus yet and his blood sugars have been running in the 200s recently. Past Medical History  Diagnosis Date  . Kidney stones     recurrent, ER 1996, 08/1999  . Diabetes mellitus   . Hyperlipidemia   . Hypertension   . Gout    History reviewed. No pertinent past surgical history. Family History  Problem Relation Age of Onset  . Diabetes Mother   . Hypertension Mother   . Kidney disease Mother   . Heart disease Mother     CHF  . Kidney disease Father   . Heart disease Father     CHF  . Coronary artery disease Father     CABG x 3  . Prostate cancer Father   . Diabetes Brother   . Colon cancer Neg Hx    History  Substance Use Topics  . Smoking status: Never Smoker   . Smokeless tobacco: Never Used  . Alcohol Use: No    Review of Systems 10 Systems reviewed and are negative for acute change except as noted in the  HPI. Allergies  Review of patient's allergies indicates no known allergies.  Home Medications   Current Outpatient Rx  Name  Route  Sig  Dispense  Refill  . allopurinol (ZYLOPRIM) 300 MG tablet   Oral   Take 1 tablet (300 mg total) by mouth daily.   30 tablet   6   . colchicine 0.6 MG tablet   Oral   Take 0.6 mg by mouth 2 (two) times daily as needed (gout).         . famotidine (PEPCID) 20 MG tablet      Take one by mouth once daily          . ibuprofen (ADVIL,MOTRIN) 200 MG tablet   Oral   Take 600 mg by mouth every 6 (six) hours as needed for pain.         Marland Kitchen lisinopril (PRINIVIL,ZESTRIL) 20 MG tablet   Oral   Take 1 tablet (20 mg total) by mouth 2 (two) times daily.   180 tablet   3   . metFORMIN (GLUCOPHAGE) 850 MG tablet   Oral   Take 850 mg by mouth 3 (three) times daily.         Marland Kitchen omeprazole (PRILOSEC OTC) 20 MG tablet      daily.          Marland Kitchen  pioglitazone (ACTOS) 45 MG tablet   Oral   Take 1 tablet (45 mg total) by mouth daily.   30 tablet   6   . pravastatin (PRAVACHOL) 40 MG tablet   Oral   Take 40 mg by mouth daily.         . insulin detemir (LEVEMIR) 100 unit/ml SOLN      5 units per day. If AM sugar>140, add 1 unit. If AM sugar<100, dec 1 unit. If 100-140, no change. 5 pens   15 mL   12   . Insulin Pen Needle 31G X 5 MM MISC      Use daily with insulin pen   100 each   3   . metoCLOPramide (REGLAN) 10 MG tablet   Oral   Take 1 tablet (10 mg total) by mouth every 6 (six) hours as needed (nausea/headache).   6 tablet   0   . ondansetron (ZOFRAN ODT) 8 MG disintegrating tablet      8mg  ODT q4 hours prn nausea   4 tablet   0   . oxyCODONE-acetaminophen (PERCOCET) 5-325 MG per tablet   Oral   Take 2 tablets by mouth every 4 (four) hours as needed for pain.   10 tablet   0    BP 123/85  Pulse 98  Temp(Src) 98 F (36.7 C) (Oral)  Resp 18  SpO2 98% Physical Exam  Nursing note and vitals reviewed. Constitutional:   Awake, alert, nontoxic appearance.  HENT:  Head: Atraumatic.  Eyes: Right eye exhibits no discharge. Left eye exhibits no discharge.  Neck: Neck supple.  Cardiovascular: Normal rate and regular rhythm.   No murmur heard. Pulmonary/Chest: Effort normal and breath sounds normal. No respiratory distress. He has no wheezes. He has no rales. He exhibits no tenderness.  Abdominal: Soft. Bowel sounds are normal. He exhibits no distension and no mass. There is no tenderness. There is no rebound and no guarding.  Genitourinary:  No CVA tenderness and scrotum nontender  Musculoskeletal: He exhibits no edema and no tenderness.  Baseline ROM, no obvious new focal weakness.  Neurological:  Mental status and motor strength appears baseline for patient and situation.  Skin: No rash noted.  Psychiatric: He has a normal mood and affect.    ED Course  Procedures (including critical care time) Patient informed of clinical course, understand medical decision-making process, and agree with plan. Labs Reviewed  URINALYSIS, ROUTINE W REFLEX MICROSCOPIC - Abnormal; Notable for the following:    APPearance CLOUDY (*)    Specific Gravity, Urine 1.036 (*)    Glucose, UA >1000 (*)    All other components within normal limits  BASIC METABOLIC PANEL - Abnormal; Notable for the following:    Sodium 133 (*)    Potassium 5.2 (*)    Glucose, Bld 427 (*)    Creatinine, Ser 1.45 (*)    GFR calc non Af Amer 55 (*)    GFR calc Af Amer 64 (*)    All other components within normal limits  CBC WITH DIFFERENTIAL - Abnormal; Notable for the following:    HCT 38.5 (*)    All other components within normal limits  URINE MICROSCOPIC-ADD ON   No results found. 1. Hyperglycemia without ketosis   2. Flank pain   3. Nephrolithiasis     MDM  I doubt any other EMC precluding discharge at this time including, but not necessarily limited to the following:DKA, sepsis.  Hurman Horn, MD 07/24/12 2031

## 2012-07-26 ENCOUNTER — Other Ambulatory Visit: Payer: Self-pay | Admitting: Family Medicine

## 2012-07-26 NOTE — Telephone Encounter (Signed)
Pt left v/m requesting status of glimepiride. Spoke with pt his BS has been running high 200-300. Pt has started insulin and that did not bring BS down; pt increasing insulin by 1 unit, but pt took Amaryl over weekend BS came down to 140. Pt wanted Dr Para March to know that Amaryl is working.Please advise. Pt request cb.

## 2012-07-26 NOTE — Telephone Encounter (Signed)
Electronic refill request.  Please advise. 

## 2012-07-26 NOTE — Telephone Encounter (Signed)
I would keep increasing the insulin to the point that he can stop the glimepiride.  Recheck A1c this fall.

## 2012-07-27 NOTE — Telephone Encounter (Signed)
Left detailed message on voicemail.  

## 2012-08-19 ENCOUNTER — Ambulatory Visit: Payer: Managed Care, Other (non HMO) | Admitting: Family Medicine

## 2012-08-24 ENCOUNTER — Ambulatory Visit (INDEPENDENT_AMBULATORY_CARE_PROVIDER_SITE_OTHER): Payer: Managed Care, Other (non HMO) | Admitting: Family Medicine

## 2012-08-24 ENCOUNTER — Encounter: Payer: Self-pay | Admitting: Family Medicine

## 2012-08-24 VITALS — BP 120/80 | HR 77 | Temp 98.1°F | Ht 67.5 in | Wt 223.0 lb

## 2012-08-24 DIAGNOSIS — E1142 Type 2 diabetes mellitus with diabetic polyneuropathy: Secondary | ICD-10-CM

## 2012-08-24 DIAGNOSIS — E1149 Type 2 diabetes mellitus with other diabetic neurological complication: Secondary | ICD-10-CM

## 2012-08-24 DIAGNOSIS — E119 Type 2 diabetes mellitus without complications: Secondary | ICD-10-CM

## 2012-08-24 MED ORDER — INSULIN DETEMIR 100 UNIT/ML FLEXPEN: SUBCUTANEOUS | Status: DC

## 2012-08-24 NOTE — Patient Instructions (Addendum)
Try 1 units adjustments.  Go to the lab on the way out.  We'll contact you with your lab report. When your sugar and insulin dose are stable, cut out the amaryl.  Give me an update in about 1-2 weeks, sooner if the swelling comes back.   Take care.

## 2012-08-25 ENCOUNTER — Encounter: Payer: Self-pay | Admitting: Family Medicine

## 2012-08-25 LAB — BASIC METABOLIC PANEL
CO2: 26 mEq/L (ref 19–32)
Chloride: 110 mEq/L (ref 96–112)
Potassium: 4.7 mEq/L (ref 3.5–5.1)
Sodium: 140 mEq/L (ref 135–145)

## 2012-08-25 NOTE — Progress Notes (Signed)
Started on insulin, then with quick titration up on dose, 5 unit increase per day.  Then had B hand and foot swelling.  Came off insulin and sugar was initially stable and the swelling decreased.  Sugar recently higher and restarted his insulin at 30 units a day.  No CP, SOB, BLE edema.  Meds, vitals, and allergies reviewed.   ROS: See HPI.  Otherwise, noncontributory.  GEN: nad, alert and oriented HEENT: mucous membranes moist NECK: supple w/o LA CV: rrr. PULM: ctab, no inc wob ABD: soft, +bs EXT: no edema SKIN: no acute rash  Diabetic foot exam: Normal inspection No skin breakdown No calluses  Normal DP pulses Normal sensation to light touch and monofilament Nails normal except for L 1st nail now growing out normally at the base

## 2012-08-25 NOTE — Assessment & Plan Note (Signed)
He initially felt better on the insulin, then had edema, then resolved.  None now.  Likely due to fluid shift with rapid increase in insulin dose.  Would continue with 1 unit per day adjustments, then when stable stop amaryl and then cut the metformin to BID.  He agrees.  He'll notify with as needed, recheck in about 3-4 months with A1c.  He agrees.

## 2012-08-26 ENCOUNTER — Encounter: Payer: Self-pay | Admitting: Family Medicine

## 2012-08-26 ENCOUNTER — Encounter: Payer: Self-pay | Admitting: *Deleted

## 2012-09-17 ENCOUNTER — Other Ambulatory Visit: Payer: Self-pay | Admitting: Family Medicine

## 2012-10-29 ENCOUNTER — Other Ambulatory Visit: Payer: Self-pay | Admitting: Family Medicine

## 2012-11-11 ENCOUNTER — Other Ambulatory Visit: Payer: Self-pay | Admitting: *Deleted

## 2012-11-11 MED ORDER — COLCHICINE 0.6 MG PO TABS: ORAL_TABLET | ORAL | Status: DC

## 2012-11-11 NOTE — Telephone Encounter (Signed)
Sent!

## 2012-11-11 NOTE — Telephone Encounter (Signed)
Faxed refill request. Please advise.  

## 2012-11-15 NOTE — Telephone Encounter (Signed)
Pt called that colcrys was not at [facility] in [place]. Medication phoned to [facility] as instructed and cancelled at [facility] pharmacy. Pt notified.

## 2012-11-18 ENCOUNTER — Other Ambulatory Visit: Payer: Self-pay

## 2012-11-29 ENCOUNTER — Other Ambulatory Visit: Payer: Self-pay | Admitting: Family Medicine

## 2013-01-25 ENCOUNTER — Other Ambulatory Visit: Payer: Self-pay | Admitting: Family Medicine

## 2013-02-23 ENCOUNTER — Other Ambulatory Visit: Payer: Self-pay | Admitting: Family Medicine

## 2013-02-23 NOTE — Telephone Encounter (Signed)
Sent, call pt.  Due for f/u labs before an OV.

## 2013-02-23 NOTE — Telephone Encounter (Signed)
Received refill request electronically. Last office visit 08/24/12. Is it okay to refill medication?

## 2013-02-24 NOTE — Telephone Encounter (Signed)
Left detailed message on voicemail.  

## 2013-04-11 ENCOUNTER — Telehealth: Payer: Self-pay

## 2013-04-11 NOTE — Telephone Encounter (Signed)
Thanks

## 2013-04-11 NOTE — Telephone Encounter (Signed)
Based on EMR records he was changed to levemir, instructed to notify us with his progress on titration.  I don't see any response from patient in months.   He is overdue for visit/labs.   I don't know what he is currently doing.  She'll need to have patient f/u with us.

## 2013-04-11 NOTE — Telephone Encounter (Signed)
[facility] at [facility] said pt states he is taking lantus solostar 45 units daily; not on current med list.Angela request clarification of Lantus solostar as well as quantity and instructions. Please advise.

## 2013-04-11 NOTE — Telephone Encounter (Signed)
Patient states he is taking lantus Solostar at 45 units daily.  He is advised to schedule OV and labs in the near future.

## 2013-05-02 ENCOUNTER — Other Ambulatory Visit (INDEPENDENT_AMBULATORY_CARE_PROVIDER_SITE_OTHER): Payer: Managed Care, Other (non HMO)

## 2013-05-02 DIAGNOSIS — E119 Type 2 diabetes mellitus without complications: Secondary | ICD-10-CM

## 2013-05-03 LAB — HEMOGLOBIN A1C: HEMOGLOBIN A1C: 7.4 % — AB (ref 4.6–6.5)

## 2013-05-05 ENCOUNTER — Encounter: Payer: Self-pay | Admitting: Family Medicine

## 2013-05-05 ENCOUNTER — Ambulatory Visit (INDEPENDENT_AMBULATORY_CARE_PROVIDER_SITE_OTHER): Payer: Managed Care, Other (non HMO) | Admitting: Family Medicine

## 2013-05-05 VITALS — BP 124/82 | HR 80 | Temp 97.9°F | Wt 228.0 lb

## 2013-05-05 DIAGNOSIS — E1149 Type 2 diabetes mellitus with other diabetic neurological complication: Secondary | ICD-10-CM

## 2013-05-05 MED ORDER — GABAPENTIN 300 MG PO CAPS: 300.0000 mg | ORAL_CAPSULE | Freq: Every day | ORAL | Status: DC

## 2013-05-05 NOTE — Progress Notes (Signed)
Pre visit review using our clinic review tool, if applicable. No additional management support is needed unless otherwise documented below in the visit note.  Diabetes:  Using medications without difficulties:yes Hypoglycemic episodes:only if prolonged fasting.  Hyperglycemic episodes:less than prev Feet problems: neuropathy per patient.  Worse with gout flares.  Taking colchicine QOD.  Pain worse at night.  No hand pain.  Can go up to the mid calf, but usually just in the feet.  Burning and tingling.   Blood Sugars averaging: ~160s or higher since  Coming off amaryl about 1 month ago.   eye exam within last year:yes, had retinopathy per patient.  Exercise and diet encouraged.   Doing better with diet than exercise.    Meds, vitals, and allergies reviewed.   ROS: See HPI.  Otherwise negative.    GEN: nad, alert and oriented HEENT: mucous membranes moist NECK: supple w/o LA CV: rrr. PULM: ctab, no inc wob ABD: soft, +bs EXT: no edema SKIN: no acute rash  Diabetic foot exam: Normal inspection No skin breakdown No calluses  Normal DP pulses Normal sensation to light touch and monofilament Nails normal

## 2013-05-05 NOTE — Assessment & Plan Note (Signed)
With worsening PM neuropathy sx.  Start gabapentin, titrate up with routine cautions. Continue work on diet, try to exercise more. Recheck labs in 3 months. Titrate levemir up in the meantime, to get AM sugar <120.  Stress follow up and compliance. He understood.

## 2013-05-05 NOTE — Patient Instructions (Addendum)
Increase the levemir 1 unit a day until your AM sugar is below 120.  Start the gabapentin, take up to 3 tabs at night.  Start with 1 tab at night.  Notify me if that doesn't help.  Recheck labs before a visit in about 3 months.   Take care.

## 2013-05-06 ENCOUNTER — Other Ambulatory Visit: Payer: Self-pay | Admitting: Family Medicine

## 2013-05-13 ENCOUNTER — Telehealth: Payer: Self-pay

## 2013-05-13 NOTE — Telephone Encounter (Signed)
Relevant patient education assigned to patient using Emmi. ° °

## 2013-05-16 ENCOUNTER — Telehealth: Payer: Self-pay

## 2013-05-16 MED ORDER — INSULIN DETEMIR 100 UNIT/ML FLEXPEN: SUBCUTANEOUS | Status: DC

## 2013-05-16 NOTE — Telephone Encounter (Signed)
Fleet Contrasachel with [facility] left v/m; Fleet Contrasachel had faxed over request with details earlier today about levemir flex pen; Fleet Contrasachel left v/m requesting update on levemir flex pen instructions; Fleet ContrasRachel said pt was taking levemir 5 units daily and pt now says that he is taking levemir 46 units daily. Fleet Contrasachel request cb or new prescription to be sent in with correct instructions.

## 2013-05-16 NOTE — Telephone Encounter (Signed)
Sent. Thanks.   

## 2013-05-30 ENCOUNTER — Other Ambulatory Visit: Payer: Self-pay

## 2013-05-30 MED ORDER — COLCHICINE 0.6 MG PO TABS: ORAL_TABLET | ORAL | Status: DC

## 2013-05-30 NOTE — Telephone Encounter (Signed)
[facility]Please advise.

## 2013-05-30 NOTE — Telephone Encounter (Signed)
Sent!

## 2013-06-02 ENCOUNTER — Other Ambulatory Visit: Payer: Self-pay | Admitting: Family Medicine

## 2013-08-17 ENCOUNTER — Other Ambulatory Visit: Payer: Self-pay

## 2013-08-17 MED ORDER — INSULIN PEN NEEDLE 31G X 5 MM MISC: Status: DC

## 2013-08-17 NOTE — Telephone Encounter (Signed)
Stephanie left v/m requesting refill pen needle to [facility]. Done with note pt needs to call for appt.

## 2013-09-27 ENCOUNTER — Other Ambulatory Visit (INDEPENDENT_AMBULATORY_CARE_PROVIDER_SITE_OTHER): Payer: Managed Care, Other (non HMO)

## 2013-09-27 DIAGNOSIS — E1149 Type 2 diabetes mellitus with other diabetic neurological complication: Secondary | ICD-10-CM

## 2013-09-28 LAB — HEMOGLOBIN A1C: Hgb A1c MFr Bld: 7.8 % — ABNORMAL HIGH (ref 4.6–6.5)

## 2013-09-29 ENCOUNTER — Ambulatory Visit (INDEPENDENT_AMBULATORY_CARE_PROVIDER_SITE_OTHER): Payer: Managed Care, Other (non HMO) | Admitting: Family Medicine

## 2013-09-29 ENCOUNTER — Encounter: Payer: Self-pay | Admitting: Family Medicine

## 2013-09-29 VITALS — BP 150/90 | HR 90 | Temp 98.3°F | Wt 233.8 lb

## 2013-09-29 DIAGNOSIS — Z23 Encounter for immunization: Secondary | ICD-10-CM

## 2013-09-29 DIAGNOSIS — N529 Male erectile dysfunction, unspecified: Secondary | ICD-10-CM

## 2013-09-29 DIAGNOSIS — E1149 Type 2 diabetes mellitus with other diabetic neurological complication: Secondary | ICD-10-CM

## 2013-09-29 MED ORDER — SILDENAFIL CITRATE 100 MG PO TABS: 50.0000 mg | ORAL_TABLET | Freq: Every day | ORAL | Status: DC | PRN

## 2013-09-29 NOTE — Patient Instructions (Signed)
I would get a flu shot each fall at work.  Recheck in 01/2014 with labs ahead of time.  Keep exercising and working on your diet.  Take care.  Glad to see you.

## 2013-09-29 NOTE — Progress Notes (Signed)
Pre visit review using our clinic review tool, if applicable. No additional management support is needed unless otherwise documented below in the visit note.  Diabetes:  Using medications without difficulties:yes Hypoglycemic episodes: no Hyperglycemic episodes: no  Feet problems: pain some better with gabapentin use.   No dec in sensation Blood Sugars averaging: ~140 Flu shot to be done at work.  Running and walking, neighborhood and treadmill.   He's working on carb restriction.  Discussed.  A1c up from last, but still overall improved compared to prev baseline  ED.  Libido still good, but ED noted.  Worse in the last 6 months.    Meds, vitals, and allergies reviewed.   ROS: See HPI.  Otherwise negative.    GEN: nad, alert and oriented HEENT: mucous membranes moist NECK: supple w/o LA CV: rrr. PULM: ctab, no inc wob ABD: soft, +bs EXT: no edema SKIN: no acute rash  Diabetic foot exam: Normal inspection No skin breakdown No calluses  Normal DP pulses Normal sensation to light touch and monofilament Nails normal

## 2013-09-30 DIAGNOSIS — N529 Male erectile dysfunction, unspecified: Secondary | ICD-10-CM | POA: Insufficient documentation

## 2013-09-30 NOTE — Assessment & Plan Note (Signed)
viagra trial with routine cautions.  D/w pt.  He agrees.

## 2013-09-30 NOTE — Assessment & Plan Note (Signed)
He'll work on diet and exercise, A1c d/w pt.  No change in meds.  He agrees.  Recheck in about 3 months. Continue gabapentin for pain in feet.  Sensation still intact. He'll get a flu shot at work.

## 2013-10-04 ENCOUNTER — Other Ambulatory Visit: Payer: Self-pay | Admitting: Family Medicine

## 2013-10-12 ENCOUNTER — Encounter: Payer: Self-pay | Admitting: Gastroenterology

## 2013-10-13 ENCOUNTER — Encounter: Payer: Self-pay | Admitting: Family Medicine

## 2013-10-14 ENCOUNTER — Other Ambulatory Visit: Payer: Self-pay | Admitting: Family Medicine

## 2013-10-14 MED ORDER — INSULIN PEN NEEDLE 32G X 4 MM MISC: Status: DC

## 2013-10-25 ENCOUNTER — Other Ambulatory Visit: Payer: Self-pay | Admitting: Family Medicine

## 2013-10-25 NOTE — Telephone Encounter (Signed)
Received refill request electronically from pharmacy. Request from pharmacy does not match the med sheet which shows 2 times a day. Please advise.

## 2013-10-26 NOTE — Telephone Encounter (Signed)
Should be BID.  Sent.

## 2013-12-01 ENCOUNTER — Other Ambulatory Visit: Payer: Self-pay | Admitting: Family Medicine

## 2013-12-01 MED ORDER — INSULIN DETEMIR 100 UNIT/ML FLEXPEN: SUBCUTANEOUS | Status: DC

## 2013-12-01 NOTE — Progress Notes (Signed)
Please call in his levemir.  I can't get it to go through as an Erx. Thanks.

## 2013-12-01 NOTE — Progress Notes (Signed)
Medication phoned to pharmacy.  

## 2013-12-01 NOTE — Progress Notes (Signed)
Ernest HaberStephanie [facility] [place] request cb at [phone] to clarify instructions for levemir.can leave detailed message if no answer.

## 2013-12-02 ENCOUNTER — Other Ambulatory Visit: Payer: Self-pay | Admitting: Family Medicine

## 2013-12-05 NOTE — Progress Notes (Signed)
Prescription that was called in for Levemir verified [facility]ela [facility].

## 2014-01-02 ENCOUNTER — Other Ambulatory Visit: Payer: Self-pay | Admitting: Family Medicine

## 2014-01-02 NOTE — Telephone Encounter (Signed)
Lipids were ok on 4/14, but not checked since then. Pt has kept dm f/u appt and labs. He is due for dm labs in 1/16, but doesn't have appt scheduled yet. Is it ok to refill?

## 2014-01-03 NOTE — Telephone Encounter (Signed)
Yes, sent, thanks.  

## 2014-02-08 ENCOUNTER — Other Ambulatory Visit: Payer: Self-pay | Admitting: Family Medicine

## 2014-03-02 ENCOUNTER — Other Ambulatory Visit: Payer: Self-pay | Admitting: Family Medicine

## 2014-05-04 ENCOUNTER — Other Ambulatory Visit: Payer: Self-pay | Admitting: Family Medicine

## 2014-05-28 ENCOUNTER — Other Ambulatory Visit: Payer: Self-pay | Admitting: Family Medicine

## 2014-05-28 DIAGNOSIS — E1149 Type 2 diabetes mellitus with other diabetic neurological complication: Secondary | ICD-10-CM

## 2014-05-28 DIAGNOSIS — M109 Gout, unspecified: Secondary | ICD-10-CM

## 2014-05-29 ENCOUNTER — Other Ambulatory Visit (INDEPENDENT_AMBULATORY_CARE_PROVIDER_SITE_OTHER): Payer: Managed Care, Other (non HMO)

## 2014-05-29 DIAGNOSIS — E1140 Type 2 diabetes mellitus with diabetic neuropathy, unspecified: Secondary | ICD-10-CM

## 2014-05-29 DIAGNOSIS — E1149 Type 2 diabetes mellitus with other diabetic neurological complication: Secondary | ICD-10-CM

## 2014-05-29 DIAGNOSIS — M109 Gout, unspecified: Secondary | ICD-10-CM

## 2014-05-29 LAB — LIPID PANEL
Cholesterol: 134 mg/dL (ref 0–200)
HDL: 37.3 mg/dL — AB (ref >39.00)
LDL Cholesterol: 77 mg/dL (ref 0–99)
NONHDL: 96.7
TRIGLYCERIDES: 100 mg/dL (ref 0.0–149.0)
Total CHOL/HDL Ratio: 4
VLDL: 20 mg/dL (ref 0.0–40.0)

## 2014-05-29 LAB — COMPREHENSIVE METABOLIC PANEL
ALBUMIN: 3.6 g/dL (ref 3.5–5.2)
ALT: 19 U/L (ref 0–53)
AST: 18 U/L (ref 0–37)
Alkaline Phosphatase: 73 U/L (ref 39–117)
BUN: 14 mg/dL (ref 6–23)
CO2: 31 mEq/L (ref 19–32)
Calcium: 9.4 mg/dL (ref 8.4–10.5)
Chloride: 107 mEq/L (ref 96–112)
Creatinine, Ser: 1.3 mg/dL (ref 0.40–1.50)
GFR: 61.45 mL/min (ref >60.00)
GLUCOSE: 149 mg/dL — AB (ref 70–99)
Potassium: 4.7 mEq/L (ref 3.5–5.1)
Sodium: 140 mEq/L (ref 135–145)
Total Bilirubin: 0.5 mg/dL (ref 0.2–1.2)
Total Protein: 7.2 g/dL (ref 6.0–8.3)

## 2014-05-29 LAB — URIC ACID: Uric Acid, Serum: 4.9 mg/dL (ref 4.0–7.8)

## 2014-05-29 LAB — HEMOGLOBIN A1C: Hgb A1c MFr Bld: 8.2 % — ABNORMAL HIGH (ref 4.6–6.5)

## 2014-05-31 ENCOUNTER — Encounter: Payer: Self-pay | Admitting: Family Medicine

## 2014-05-31 ENCOUNTER — Ambulatory Visit (INDEPENDENT_AMBULATORY_CARE_PROVIDER_SITE_OTHER): Payer: Managed Care, Other (non HMO) | Admitting: Family Medicine

## 2014-05-31 VITALS — BP 144/100 | HR 84 | Temp 99.9°F | Wt 245.0 lb

## 2014-05-31 DIAGNOSIS — E1149 Type 2 diabetes mellitus with other diabetic neurological complication: Secondary | ICD-10-CM

## 2014-05-31 DIAGNOSIS — E1140 Type 2 diabetes mellitus with diabetic neuropathy, unspecified: Secondary | ICD-10-CM | POA: Diagnosis not present

## 2014-05-31 DIAGNOSIS — I10 Essential (primary) hypertension: Secondary | ICD-10-CM

## 2014-05-31 MED ORDER — METOPROLOL TARTRATE 25 MG PO TABS
25.0000 mg | ORAL_TABLET | Freq: Every day | ORAL | Status: DC | PRN
Start: 2014-05-31 — End: 2014-10-03

## 2014-05-31 MED ORDER — METOPROLOL SUCCINATE ER 25 MG PO TB24: 25.0000 mg | ORAL_TABLET | Freq: Every day | ORAL | Status: DC

## 2014-05-31 MED ORDER — INSULIN DETEMIR 100 UNIT/ML FLEXPEN
SUBCUTANEOUS | Status: DC
Start: 2014-05-31 — End: 2015-01-16

## 2014-05-31 NOTE — Progress Notes (Signed)
Pre visit review using our clinic review tool, if applicable. No additional management support is needed unless otherwise documented below in the visit note.  Diabetes:  Using medications without difficulties:yes Hypoglycemic episodes: no Hyperglycemic episodes: no Feet problems: improved with qhs gabapentin Blood Sugars averaging: usually ~125 in the AM, diet dependent.   eye exam within last year: due, d/w pt.  His work strain has increased.  His weight is up.  He is trying to work on his weight, d/w pt.   A1c up some, d/w pt.    He has notes some anxiety and trouble with public speaking.  He was asking about options.  Noted that BP has been up when patient had checked.    Meds, vitals, and allergies reviewed.   ROS: See HPI.  Otherwise negative.    GEN: nad, alert and oriented HEENT: mucous membranes moist NECK: supple w/o LA CV: rrr. PULM: ctab, no inc wob ABD: soft, +bs EXT: no edema SKIN: no acute rash  Diabetic foot exam: Normal inspection No skin breakdown No calluses  Normal DP pulses Normal sensation to light touch and monofilament Nails normal

## 2014-05-31 NOTE — Patient Instructions (Signed)
Try the long acting metoprolol, 1-2 tabs a day for pressure.   Take short acting metoprolol if needed.  Work on diet and exercise in the meantime.  Recheck labs in about 3-4 months.   Take care. Glad to see you.

## 2014-06-01 NOTE — Assessment & Plan Note (Signed)
A1c up, he would like to avoid other meds .  Work on diet and exercise in the meantime.  Recheck labs in about 3-4 months.  He agrees.   >25 minutes spent in face to face time with patient, >50% spent in counselling or coordination of care

## 2014-06-01 NOTE — Assessment & Plan Note (Signed)
With anxiety about public speaking.  Try the long acting metoprolol, 1-2 tabs a day for pressure.  Take short acting metoprolol if needed.  He agrees.  Can update me as needed.

## 2014-08-01 ENCOUNTER — Other Ambulatory Visit: Payer: Self-pay | Admitting: Family Medicine

## 2014-08-01 NOTE — Telephone Encounter (Signed)
Electronic refill request. Last Filled:    90 capsule 0 Rf on 05/04/2014  Please advise.

## 2014-08-02 NOTE — Telephone Encounter (Signed)
Sent. Thanks.   

## 2014-09-01 ENCOUNTER — Other Ambulatory Visit: Payer: Self-pay | Admitting: Family Medicine

## 2014-10-03 ENCOUNTER — Other Ambulatory Visit: Payer: Self-pay | Admitting: Family Medicine

## 2014-11-02 ENCOUNTER — Other Ambulatory Visit: Payer: Self-pay | Admitting: Family Medicine

## 2014-12-08 ENCOUNTER — Other Ambulatory Visit: Payer: Self-pay | Admitting: Family Medicine

## 2014-12-11 ENCOUNTER — Other Ambulatory Visit: Payer: Self-pay | Admitting: *Deleted

## 2014-12-11 MED ORDER — METOPROLOL TARTRATE 25 MG PO TABS: ORAL_TABLET | ORAL | Status: DC

## 2014-12-11 NOTE — Telephone Encounter (Signed)
Received refill request electronically Last refill 11/02/14 #30 Last office visit 05/31/14 Is it okay to refill?

## 2014-12-11 NOTE — Telephone Encounter (Signed)
Sent. Thanks.  Due for f/u labs and OV.

## 2014-12-11 NOTE — Telephone Encounter (Signed)
Faxed refill request. Last Filled:    30 tablet 0 11/02/2014      Sig: TAKE ONE TABLET BY MOUTH ONCE DAILY AS NEEDED FOR PUBLIC SPEAKING  Please advise.

## 2014-12-12 NOTE — Telephone Encounter (Signed)
Spoke with pt  He will call back this afternoon with his schedule to make appointment

## 2014-12-12 NOTE — Telephone Encounter (Signed)
Please schedule f/u labs and OV. Thank you!

## 2014-12-12 NOTE — Telephone Encounter (Signed)
Dr Para Marchuncan pt

## 2014-12-26 ENCOUNTER — Other Ambulatory Visit: Payer: Self-pay | Admitting: *Deleted

## 2014-12-26 MED ORDER — INSULIN PEN NEEDLE 32G X 4 MM MISC: Status: DC

## 2014-12-29 NOTE — Telephone Encounter (Signed)
Lab 12/30 appt 1/4 Pt aware

## 2014-12-31 ENCOUNTER — Other Ambulatory Visit: Payer: Self-pay | Admitting: Family Medicine

## 2014-12-31 DIAGNOSIS — E1149 Type 2 diabetes mellitus with other diabetic neurological complication: Secondary | ICD-10-CM

## 2015-01-03 ENCOUNTER — Other Ambulatory Visit: Payer: Self-pay | Admitting: Family Medicine

## 2015-01-12 ENCOUNTER — Other Ambulatory Visit (INDEPENDENT_AMBULATORY_CARE_PROVIDER_SITE_OTHER): Payer: Managed Care, Other (non HMO)

## 2015-01-12 DIAGNOSIS — E1149 Type 2 diabetes mellitus with other diabetic neurological complication: Secondary | ICD-10-CM | POA: Diagnosis not present

## 2015-01-12 LAB — HEMOGLOBIN A1C: HEMOGLOBIN A1C: 7.1 % — AB (ref 4.6–6.5)

## 2015-01-12 LAB — BASIC METABOLIC PANEL
BUN: 16 mg/dL (ref 6–23)
CALCIUM: 9.5 mg/dL (ref 8.4–10.5)
CO2: 30 meq/L (ref 19–32)
CREATININE: 1.48 mg/dL (ref 0.40–1.50)
Chloride: 103 mEq/L (ref 96–112)
GFR: 52.78 mL/min — ABNORMAL LOW (ref >60.00)
Glucose, Bld: 201 mg/dL — ABNORMAL HIGH (ref 70–99)
Potassium: 4.6 mEq/L (ref 3.5–5.1)
Sodium: 139 mEq/L (ref 135–145)

## 2015-01-13 ENCOUNTER — Other Ambulatory Visit: Payer: Self-pay | Admitting: Family Medicine

## 2015-01-16 ENCOUNTER — Other Ambulatory Visit: Payer: Self-pay | Admitting: *Deleted

## 2015-01-16 NOTE — Telephone Encounter (Signed)
Faxed refill request. Last Filled:    75 mL 3 05/31/2014 06/01/2015  However, this request is from [facility].  Previous RF was local.  Please advise.

## 2015-01-17 ENCOUNTER — Other Ambulatory Visit: Payer: Self-pay | Admitting: Family Medicine

## 2015-01-17 ENCOUNTER — Ambulatory Visit (INDEPENDENT_AMBULATORY_CARE_PROVIDER_SITE_OTHER): Payer: Managed Care, Other (non HMO) | Admitting: Family Medicine

## 2015-01-17 ENCOUNTER — Telehealth: Payer: Self-pay | Admitting: *Deleted

## 2015-01-17 ENCOUNTER — Encounter: Payer: Self-pay | Admitting: Family Medicine

## 2015-01-17 VITALS — BP 132/84 | HR 77 | Temp 98.5°F | Wt 243.2 lb

## 2015-01-17 DIAGNOSIS — E119 Type 2 diabetes mellitus without complications: Secondary | ICD-10-CM

## 2015-01-17 DIAGNOSIS — Z119 Encounter for screening for infectious and parasitic diseases, unspecified: Secondary | ICD-10-CM | POA: Diagnosis not present

## 2015-01-17 DIAGNOSIS — E1149 Type 2 diabetes mellitus with other diabetic neurological complication: Secondary | ICD-10-CM

## 2015-01-17 LAB — BASIC METABOLIC PANEL
BUN: 19 mg/dL (ref 6–23)
CO2: 28 mEq/L (ref 19–32)
Calcium: 9.9 mg/dL (ref 8.4–10.5)
Chloride: 103 mEq/L (ref 96–112)
Creatinine, Ser: 1.43 mg/dL (ref 0.40–1.50)
GFR: 54.92 mL/min — AB (ref >60.00)
GLUCOSE: 239 mg/dL — AB (ref 70–99)
POTASSIUM: 4.6 meq/L (ref 3.5–5.1)
SODIUM: 138 meq/L (ref 135–145)

## 2015-01-17 MED ORDER — INSULIN DETEMIR 100 UNIT/ML FLEXPEN: SUBCUTANEOUS | Status: DC

## 2015-01-17 NOTE — Telephone Encounter (Signed)
Received fax from [facility] requesting a PA for Levemir. Called and spoke to Monsonarrie at [facility] and was advised that a PA can not be done because this medication is not a covered medication. Lyla SonCarrie said the options are to have a letter sent to appeal the benefits or have the doctor change the medication to one that is on the formulary. Lyla Son. Carrie stated that the fastest way to get the patient a medication is to have the patient check his formulary and see if the doctor would switch the Levemir to something that is on his formulary.  Left message for patient to call back and advise him of this.

## 2015-01-17 NOTE — Progress Notes (Signed)
Pre visit review using our clinic review tool, if applicable. No additional management support is needed unless otherwise documented below in the visit note.  He passed a renal stone last week.  No pain now.  Urination is back to normal.  That may have affected his recent Cr, d/w pt.    Diabetes:  Using medications without difficulties:yes Hypoglycemic episodes:no Hyperglycemic episodes:no Feet problems: still with sx at night, improved with gabapentin and sugar control.   Blood Sugars averaging: usually ~140 in AMs, occ lower.  Variable with diet.  eye exam within last year:due, d/w pt.  A1c 7.1, the best it has been in years.  He's been working on adherence, compliance.    Pt opts in for HCV and HIV screening.  D/w pt re: routine screening.    Meds, vitals, and allergies reviewed.   ROS: See HPI.  Otherwise negative.    GEN: nad, alert and oriented HEENT: mucous membranes moist NECK: supple w/o LA CV: rrr. PULM: ctab, no inc wob ABD: soft, +bs EXT: no edema  Diabetic foot exam: Normal inspection No skin breakdown No calluses  Normal DP pulses Normal sensation to light touch and monofilament Nails normal except for L 1st nail loose after a gout attack prev (this nail should eventually fall off, but the toe looks normal o/w and I would leave it in place for now, d/w pt)

## 2015-01-17 NOTE — Telephone Encounter (Signed)
Sent.  Will d/w pt at OV.

## 2015-01-17 NOTE — Patient Instructions (Addendum)
Go to the lab on the way out.  We'll contact you with your lab report. Call about an eye exam.  Recheck A1c and lipids before a visit in about 3-4 months Let me know if you have more renal stones.     Take care.  Glad to see you.

## 2015-01-18 ENCOUNTER — Other Ambulatory Visit: Payer: Self-pay | Admitting: Family Medicine

## 2015-01-18 LAB — HEPATITIS C ANTIBODY: HCV Ab: NEGATIVE

## 2015-01-18 LAB — HIV ANTIBODY (ROUTINE TESTING W REFLEX): HIV: NONREACTIVE

## 2015-01-18 MED ORDER — INSULIN GLARGINE 100 UNIT/ML SOLOSTAR PEN: PEN_INJECTOR | SUBCUTANEOUS | Status: DC

## 2015-01-18 NOTE — Telephone Encounter (Signed)
Sent. Thanks.   

## 2015-01-18 NOTE — Telephone Encounter (Signed)
Patient advised and called his insurance company and says they will cover Lantus Solostar.  Please send that Rx.

## 2015-01-18 NOTE — Assessment & Plan Note (Signed)
Encouraged diet and exercise.  eye exam due, d/w pt.  A1c 7.1, the best it has been in years. He's been working on adherence, compliance.  Recheck A1c in a few months.  See notes on labs re: repeat Cr.   Okay for outpatient f/u.

## 2015-01-26 ENCOUNTER — Telehealth: Payer: Self-pay

## 2015-01-26 MED ORDER — INSULIN GLARGINE 100 UNIT/ML SOLOSTAR PEN: PEN_INJECTOR | SUBCUTANEOUS | Status: DC

## 2015-01-26 NOTE — Telephone Encounter (Signed)
Patrick Watts with [facility] said lantus solostar 20 pens would be over 3 month fill and would cost pt 2 ninety day copays; request to change quantity to # 18 pens for 3 months. Advised OK to change quantity to # 18 pens.

## 2015-01-30 ENCOUNTER — Other Ambulatory Visit: Payer: Self-pay | Admitting: Family Medicine

## 2015-01-30 NOTE — Telephone Encounter (Signed)
Electronic refill request. Last Filled:   (?) 06/24/2010.  Patient was recently in for OV and signified taking the medication.  Please advise.

## 2015-01-31 ENCOUNTER — Other Ambulatory Visit: Payer: Self-pay | Admitting: Family Medicine

## 2015-01-31 NOTE — Telephone Encounter (Signed)
Received refill request electronically Last refill 12/11/14 #30 Last office visit 01/17/15 Is it okay to refill?

## 2015-01-31 NOTE — Telephone Encounter (Signed)
Sent. Thanks.   

## 2015-02-07 ENCOUNTER — Other Ambulatory Visit: Payer: Self-pay | Admitting: Family Medicine

## 2015-03-15 ENCOUNTER — Other Ambulatory Visit: Payer: Self-pay | Admitting: Family Medicine

## 2015-04-13 ENCOUNTER — Other Ambulatory Visit: Payer: Self-pay | Admitting: Family Medicine

## 2015-06-20 ENCOUNTER — Other Ambulatory Visit: Payer: Self-pay | Admitting: Family Medicine

## 2015-07-02 ENCOUNTER — Other Ambulatory Visit: Payer: Self-pay | Admitting: Family Medicine

## 2015-08-06 ENCOUNTER — Other Ambulatory Visit: Payer: Self-pay | Admitting: Family Medicine

## 2015-08-22 LAB — HM DIABETES EYE EXAM

## 2015-09-03 ENCOUNTER — Other Ambulatory Visit: Payer: Self-pay | Admitting: Family Medicine

## 2015-09-04 ENCOUNTER — Encounter: Payer: Self-pay | Admitting: Family Medicine

## 2015-09-07 ENCOUNTER — Encounter: Payer: Self-pay | Admitting: Family Medicine

## 2015-09-07 ENCOUNTER — Encounter (INDEPENDENT_AMBULATORY_CARE_PROVIDER_SITE_OTHER): Payer: Self-pay | Admitting: Ophthalmology

## 2015-09-07 ENCOUNTER — Other Ambulatory Visit: Payer: Self-pay | Admitting: Family Medicine

## 2015-09-07 MED ORDER — METOPROLOL SUCCINATE ER 25 MG PO TB24: ORAL_TABLET | ORAL | 0 refills | Status: DC

## 2015-09-07 NOTE — Telephone Encounter (Signed)
Sent both.  Thanks.  Will d/w pt at OV.

## 2015-09-07 NOTE — Telephone Encounter (Signed)
Electronic refill request. Last Filled:     30 tablet 0 08/06/2015  This medication is listed: As needed for public speaking, yet she received a 30 day supply last month.  Is this okay?

## 2015-09-07 NOTE — Telephone Encounter (Signed)
Pt left v/m requesting status of metoprolol tartrate;lopressor refill; pt is out of medication.Please advise.

## 2015-09-10 ENCOUNTER — Other Ambulatory Visit: Payer: Self-pay | Admitting: *Deleted

## 2015-09-12 ENCOUNTER — Encounter (INDEPENDENT_AMBULATORY_CARE_PROVIDER_SITE_OTHER): Payer: Self-pay | Admitting: Ophthalmology

## 2015-09-12 ENCOUNTER — Ambulatory Visit: Payer: Managed Care, Other (non HMO) | Admitting: Family Medicine

## 2015-09-12 ENCOUNTER — Other Ambulatory Visit: Payer: Self-pay | Admitting: Family Medicine

## 2015-09-12 NOTE — Telephone Encounter (Signed)
Received refill electronically Last refill 08/02/14 #90/3 Last office visit 01/17/15

## 2015-09-12 NOTE — Telephone Encounter (Signed)
Sent. Thanks.   

## 2015-09-21 ENCOUNTER — Other Ambulatory Visit: Payer: Self-pay | Admitting: *Deleted

## 2015-09-21 NOTE — Telephone Encounter (Signed)
Faxed refill request. Last Filled:    60 tablet 3 05/30/2013  Lab and OV scheduled mid September 2017.  Please advise.

## 2015-09-23 MED ORDER — COLCHICINE 0.6 MG PO TABS: ORAL_TABLET | ORAL | 3 refills | Status: DC

## 2015-09-23 NOTE — Telephone Encounter (Signed)
Sent. Thanks.   

## 2015-10-01 ENCOUNTER — Other Ambulatory Visit: Payer: Self-pay | Admitting: Family Medicine

## 2015-10-01 ENCOUNTER — Other Ambulatory Visit (INDEPENDENT_AMBULATORY_CARE_PROVIDER_SITE_OTHER): Payer: Managed Care, Other (non HMO)

## 2015-10-01 DIAGNOSIS — M109 Gout, unspecified: Secondary | ICD-10-CM

## 2015-10-01 DIAGNOSIS — E119 Type 2 diabetes mellitus without complications: Secondary | ICD-10-CM | POA: Diagnosis not present

## 2015-10-02 LAB — URIC ACID: Uric Acid, Serum: 5.4 mg/dL (ref 4.0–7.8)

## 2015-10-02 LAB — BASIC METABOLIC PANEL
BUN: 20 mg/dL (ref 6–23)
CO2: 27 mEq/L (ref 19–32)
Calcium: 9 mg/dL (ref 8.4–10.5)
Chloride: 108 mEq/L (ref 96–112)
Creatinine, Ser: 1.45 mg/dL (ref 0.40–1.50)
GFR: 53.9 mL/min — AB (ref >60.00)
Glucose, Bld: 142 mg/dL — ABNORMAL HIGH (ref 70–99)
POTASSIUM: 4.7 meq/L (ref 3.5–5.1)
SODIUM: 139 meq/L (ref 135–145)

## 2015-10-02 LAB — HEMOGLOBIN A1C: HEMOGLOBIN A1C: 6.5 % (ref 4.6–6.5)

## 2015-10-02 LAB — LIPID PANEL
CHOL/HDL RATIO: 4
Cholesterol: 126 mg/dL (ref 0–200)
HDL: 33.9 mg/dL — ABNORMAL LOW (ref >39.00)
LDL Cholesterol: 65 mg/dL (ref 0–99)
NONHDL: 91.79
Triglycerides: 134 mg/dL (ref 0.0–149.0)
VLDL: 26.8 mg/dL (ref 0.0–40.0)

## 2015-10-03 ENCOUNTER — Ambulatory Visit (INDEPENDENT_AMBULATORY_CARE_PROVIDER_SITE_OTHER): Payer: Managed Care, Other (non HMO) | Admitting: Family Medicine

## 2015-10-03 ENCOUNTER — Encounter: Payer: Self-pay | Admitting: Family Medicine

## 2015-10-03 VITALS — BP 128/78 | HR 74 | Temp 98.5°F | Wt 259.5 lb

## 2015-10-03 DIAGNOSIS — E1149 Type 2 diabetes mellitus with other diabetic neurological complication: Secondary | ICD-10-CM

## 2015-10-03 DIAGNOSIS — I10 Essential (primary) hypertension: Secondary | ICD-10-CM

## 2015-10-03 DIAGNOSIS — E785 Hyperlipidemia, unspecified: Secondary | ICD-10-CM | POA: Diagnosis not present

## 2015-10-03 NOTE — Progress Notes (Signed)
Pre visit review using our clinic review tool, if applicable. No additional management support is needed unless otherwise documented below in the visit note. 

## 2015-10-03 NOTE — Patient Instructions (Signed)
Recheck labs in about 6 months before a visit.  Flu shot at work.  Take care.  Glad to see you.  Update me as needed.

## 2015-10-03 NOTE — Progress Notes (Signed)
Diabetes:  Using medications without difficulties:yes Hypoglycemic episodes:no Hyperglycemic episodes:no Feet problems: some better than prev baseline, still on gabapentin at night.   Blood Sugars averaging: usually ~130.  eye exam within last year: yes, has f/u with eye clinic pending.  Labs d/w pt.   A1c d/w pt, improved.   Flu shot to be done at work, d/w pt.   D/w pt about diet and exercise.  Eating more salads.  Started walking in the meantime.    HTN.  No CP SOB BLE edema.   HLD. No ADE on med.  Compliant.  Labs d/w pt.  Working on diet and exercise.  See above.    Meds, vitals, and allergies reviewed.   ROS: Per HPI unless specifically indicated in ROS section   GEN: nad, alert and oriented HEENT: mucous membranes moist NECK: supple w/o LA CV: rrr. PULM: ctab, no inc wob ABD: soft, +bs EXT: no edema SKIN: no acute rash  Diabetic foot exam: Normal inspection No skin breakdown No calluses  Normal DP pulses Normal sensation to light touch and monofilament Nails normal

## 2015-10-04 ENCOUNTER — Encounter: Payer: Self-pay | Admitting: Family Medicine

## 2015-10-04 NOTE — Assessment & Plan Note (Signed)
Controlled. Labs discussed with patient. Continue as is.

## 2015-10-04 NOTE — Assessment & Plan Note (Signed)
Controlled. Continue current medications. Labs discussed with patient. No adverse effect on medication.

## 2015-10-04 NOTE — Assessment & Plan Note (Signed)
A1c controlled. Foot burning/pain improved. Compliant with medications. No change in meds. Continue work on diet and exercise. When he sugar drifts down with diet and exercise, he can lower his insulin dose. Discussed with patient. He agrees. Recheck in about 6 months, sooner as needed. He can get his flu shot at work.

## 2015-10-05 ENCOUNTER — Other Ambulatory Visit: Payer: Self-pay | Admitting: Family Medicine

## 2015-10-05 NOTE — Telephone Encounter (Signed)
Electronic refill request. Last Filled:        30 tablet 0 09/07/2015  Last office visit:     10/03/15  Directions state as needed for public speaking.  Should it be time to RF?

## 2015-10-06 NOTE — Telephone Encounter (Signed)
Please clarify with patient.  I thought this was handled at the OV, that he was good to go on refills.  Thanks.

## 2015-10-08 NOTE — Telephone Encounter (Signed)
Pt returned call - please call back, thank you

## 2015-10-08 NOTE — Telephone Encounter (Signed)
Left detailed message on voicemail to return call with response. 

## 2015-10-08 NOTE — Telephone Encounter (Signed)
Patient says when he saw you in the office, the computers were down and you were not even able to give him his AVS or do anything about the refills.

## 2015-10-09 NOTE — Telephone Encounter (Signed)
Patient states that he is taking both the XL and the tartrate.  He takes the XL before bed and the tartrate each morning before work and he takes them both on a daily basis, not just as needed for public speaking.

## 2015-10-09 NOTE — Telephone Encounter (Signed)
I realize that, but in discussion with patient I thought he was only taking the XL and not the tartrate formulation.   If he is only taking this prn, but needs refills, then please send in #30 with 5rf.  Thanks.

## 2015-10-12 ENCOUNTER — Other Ambulatory Visit: Payer: Self-pay | Admitting: Family Medicine

## 2015-10-31 ENCOUNTER — Other Ambulatory Visit: Payer: Self-pay | Admitting: Family Medicine

## 2015-11-01 ENCOUNTER — Other Ambulatory Visit: Payer: Self-pay | Admitting: *Deleted

## 2015-11-27 ENCOUNTER — Other Ambulatory Visit: Payer: Self-pay | Admitting: Family Medicine

## 2016-01-25 ENCOUNTER — Other Ambulatory Visit: Payer: Self-pay | Admitting: *Deleted

## 2016-01-25 MED ORDER — INSULIN PEN NEEDLE 32G X 4 MM MISC: 3 refills | Status: DC

## 2016-02-06 LAB — HM DIABETES EYE EXAM

## 2016-02-08 ENCOUNTER — Other Ambulatory Visit: Payer: Self-pay | Admitting: Family Medicine

## 2016-03-18 ENCOUNTER — Encounter: Payer: Self-pay | Admitting: Family Medicine

## 2016-04-04 ENCOUNTER — Other Ambulatory Visit: Payer: Self-pay | Admitting: Family Medicine

## 2016-04-23 ENCOUNTER — Encounter: Payer: Self-pay | Admitting: Family Medicine

## 2016-04-23 NOTE — Telephone Encounter (Signed)
Patrick Watts with [facility] pharmacy in [place] left v/m requesting refills for Lantus; faxed request but has not gotten response. Request cb at [phone].

## 2016-04-23 NOTE — Telephone Encounter (Signed)
Received refill request from pharmacy and patient Refill request does not match medication list Okay to refill as requested?

## 2016-04-24 ENCOUNTER — Other Ambulatory Visit: Payer: Self-pay | Admitting: Family Medicine

## 2016-04-24 MED ORDER — INSULIN GLARGINE 100 UNIT/ML SOLOSTAR PEN: PEN_INJECTOR | SUBCUTANEOUS | 99 refills | Status: DC

## 2016-04-30 ENCOUNTER — Ambulatory Visit: Payer: Managed Care, Other (non HMO) | Admitting: Family Medicine

## 2016-05-01 ENCOUNTER — Other Ambulatory Visit: Payer: Self-pay | Admitting: Family Medicine

## 2016-05-01 NOTE — Telephone Encounter (Signed)
Electronic refill request. Last office visit:   10/03/15 Last Filled:    90 capsule 3 09/12/2015  Please advise.

## 2016-05-02 NOTE — Telephone Encounter (Signed)
Sent!

## 2016-05-11 ENCOUNTER — Other Ambulatory Visit: Payer: Self-pay | Admitting: Family Medicine

## 2016-05-11 DIAGNOSIS — E1149 Type 2 diabetes mellitus with other diabetic neurological complication: Secondary | ICD-10-CM

## 2016-05-12 ENCOUNTER — Other Ambulatory Visit: Payer: Self-pay | Admitting: Family Medicine

## 2016-05-17 ENCOUNTER — Other Ambulatory Visit: Payer: Self-pay | Admitting: Family Medicine

## 2016-05-19 ENCOUNTER — Other Ambulatory Visit (INDEPENDENT_AMBULATORY_CARE_PROVIDER_SITE_OTHER): Payer: Managed Care, Other (non HMO)

## 2016-05-19 DIAGNOSIS — E1149 Type 2 diabetes mellitus with other diabetic neurological complication: Secondary | ICD-10-CM

## 2016-05-19 LAB — HEMOGLOBIN A1C: HEMOGLOBIN A1C: 7 % — AB (ref 4.6–6.5)

## 2016-05-21 ENCOUNTER — Encounter: Payer: Self-pay | Admitting: Family Medicine

## 2016-05-21 ENCOUNTER — Ambulatory Visit (INDEPENDENT_AMBULATORY_CARE_PROVIDER_SITE_OTHER): Payer: Managed Care, Other (non HMO) | Admitting: Family Medicine

## 2016-05-21 DIAGNOSIS — E1149 Type 2 diabetes mellitus with other diabetic neurological complication: Secondary | ICD-10-CM

## 2016-05-21 NOTE — Progress Notes (Signed)
Pre visit review using our clinic review tool, if applicable. No additional management support is needed unless otherwise documented below in the visit note. 

## 2016-05-21 NOTE — Patient Instructions (Signed)
Work on portion control and walking.  Recheck in about 3-4 months.   Update me as needed.  Take care.  Glad to see you.

## 2016-05-21 NOTE — Progress Notes (Signed)
Diabetes:  Using medications without difficulties:yes Hypoglycemic episodes:no Hyperglycemic episodes:no Feet problems:pain controlled with gabapentin Blood Sugars averaging:  Usually ~120s, occ higher eye exam within last year:yes Weight is up.  D/w pt.  A1c reasonable but up from prev.  D/w pt.  He hasn't been exercising as much.  D/w pt.  Needs more walking, etc. D/w pt about portion control.    Meds, vitals, and allergies reviewed.   ROS: Per HPI unless specifically indicated in ROS section   GEN: nad, alert and oriented HEENT: mucous membranes moist NECK: supple w/o LA CV: rrr. PULM: ctab, no inc wob ABD: soft, +bs EXT: no edema  Diabetic foot exam: Normal inspection No skin breakdown No calluses  Normal DP pulses Normal sensation to light touch and monofilament Nails normal

## 2016-05-21 NOTE — Assessment & Plan Note (Signed)
Weight is up.  A1c reasonable but up from prev.  He hasn't been exercising as much.    Needs more walking, etc. D/w pt about portion control with diet and the items above.   No change in meds.  Recheck in about 3-4 months before visit.  He agrees.

## 2016-06-02 ENCOUNTER — Other Ambulatory Visit: Payer: Self-pay | Admitting: Family Medicine

## 2016-08-11 ENCOUNTER — Other Ambulatory Visit: Payer: Self-pay | Admitting: Family Medicine

## 2016-09-08 ENCOUNTER — Other Ambulatory Visit: Payer: Self-pay | Admitting: Family Medicine

## 2016-12-02 ENCOUNTER — Other Ambulatory Visit: Payer: Self-pay | Admitting: Family Medicine

## 2017-01-16 ENCOUNTER — Other Ambulatory Visit: Payer: Self-pay | Admitting: Family Medicine

## 2017-01-29 ENCOUNTER — Other Ambulatory Visit: Payer: Self-pay | Admitting: Family Medicine

## 2017-03-07 ENCOUNTER — Other Ambulatory Visit: Payer: Self-pay | Admitting: Family Medicine

## 2017-03-09 ENCOUNTER — Other Ambulatory Visit: Payer: Self-pay | Admitting: *Deleted

## 2017-03-09 NOTE — Telephone Encounter (Signed)
Electronic refill request. Allopurinol Last office visit:   05/21/16 Last Filled:    30 tablet 2 09/08/2016  Please advise.

## 2017-03-11 NOTE — Telephone Encounter (Signed)
Left message on voice mail  to call back

## 2017-03-11 NOTE — Telephone Encounter (Signed)
Overdue for follow up.   Schedule f/u and I'll fill rx.  Thanks.  Denied in the meantime.

## 2017-03-17 NOTE — Telephone Encounter (Signed)
Patient advised.

## 2017-06-11 ENCOUNTER — Other Ambulatory Visit: Payer: Self-pay | Admitting: *Deleted

## 2017-06-11 MED ORDER — INSULIN GLARGINE 100 UNIT/ML SOLOSTAR PEN: PEN_INJECTOR | SUBCUTANEOUS | 0 refills | Status: DC

## 2017-06-21 ENCOUNTER — Encounter: Payer: Self-pay | Admitting: Family Medicine

## 2017-06-21 ENCOUNTER — Other Ambulatory Visit: Payer: Self-pay | Admitting: Family Medicine

## 2017-06-21 DIAGNOSIS — M109 Gout, unspecified: Secondary | ICD-10-CM

## 2017-06-21 DIAGNOSIS — E1149 Type 2 diabetes mellitus with other diabetic neurological complication: Secondary | ICD-10-CM

## 2017-06-21 DIAGNOSIS — Z125 Encounter for screening for malignant neoplasm of prostate: Secondary | ICD-10-CM

## 2017-06-22 ENCOUNTER — Other Ambulatory Visit: Payer: Managed Care, Other (non HMO)

## 2017-06-23 ENCOUNTER — Other Ambulatory Visit (INDEPENDENT_AMBULATORY_CARE_PROVIDER_SITE_OTHER): Payer: Managed Care, Other (non HMO)

## 2017-06-23 DIAGNOSIS — E1149 Type 2 diabetes mellitus with other diabetic neurological complication: Secondary | ICD-10-CM | POA: Diagnosis not present

## 2017-06-23 DIAGNOSIS — M109 Gout, unspecified: Secondary | ICD-10-CM | POA: Diagnosis not present

## 2017-06-23 DIAGNOSIS — Z125 Encounter for screening for malignant neoplasm of prostate: Secondary | ICD-10-CM | POA: Diagnosis not present

## 2017-06-24 LAB — URIC ACID: Uric Acid, Serum: 8.1 mg/dL — ABNORMAL HIGH (ref 4.0–7.8)

## 2017-06-24 LAB — COMPREHENSIVE METABOLIC PANEL
ALBUMIN: 3.6 g/dL (ref 3.5–5.2)
ALT: 18 U/L (ref 0–53)
AST: 19 U/L (ref 0–37)
Alkaline Phosphatase: 77 U/L (ref 39–117)
BUN: 24 mg/dL — ABNORMAL HIGH (ref 6–23)
CALCIUM: 9.3 mg/dL (ref 8.4–10.5)
CHLORIDE: 110 meq/L (ref 96–112)
CO2: 27 meq/L (ref 19–32)
Creatinine, Ser: 2.08 mg/dL — ABNORMAL HIGH (ref 0.40–1.50)
GFR: 35.32 mL/min — AB (ref >60.00)
Glucose, Bld: 99 mg/dL (ref 70–99)
POTASSIUM: 4.6 meq/L (ref 3.5–5.1)
Sodium: 145 mEq/L (ref 135–145)
Total Bilirubin: 0.4 mg/dL (ref 0.2–1.2)
Total Protein: 6.7 g/dL (ref 6.0–8.3)

## 2017-06-24 LAB — LIPID PANEL
CHOL/HDL RATIO: 4
CHOLESTEROL: 137 mg/dL (ref 0–200)
HDL: 35 mg/dL — ABNORMAL LOW (ref >39.00)
LDL CALC: 86 mg/dL (ref 0–99)
NonHDL: 102.28
TRIGLYCERIDES: 83 mg/dL (ref 0.0–149.0)
VLDL: 16.6 mg/dL (ref 0.0–40.0)

## 2017-06-24 LAB — PSA: PSA: 1.01 ng/mL (ref 0.10–4.00)

## 2017-06-24 LAB — HEMOGLOBIN A1C: Hgb A1c MFr Bld: 6.6 % — ABNORMAL HIGH (ref 4.6–6.5)

## 2017-06-25 ENCOUNTER — Encounter: Payer: Self-pay | Admitting: Family Medicine

## 2017-06-25 ENCOUNTER — Ambulatory Visit: Payer: Managed Care, Other (non HMO) | Admitting: Family Medicine

## 2017-06-25 VITALS — BP 152/92 | HR 63 | Temp 97.7°F | Ht 68.5 in | Wt 304.2 lb

## 2017-06-25 DIAGNOSIS — N179 Acute kidney failure, unspecified: Secondary | ICD-10-CM

## 2017-06-25 DIAGNOSIS — R7989 Other specified abnormal findings of blood chemistry: Secondary | ICD-10-CM | POA: Diagnosis not present

## 2017-06-25 DIAGNOSIS — E1149 Type 2 diabetes mellitus with other diabetic neurological complication: Secondary | ICD-10-CM

## 2017-06-25 MED ORDER — INSULIN GLARGINE 100 UNIT/ML SOLOSTAR PEN: PEN_INJECTOR | SUBCUTANEOUS | Status: DC

## 2017-06-25 MED ORDER — METOPROLOL SUCCINATE ER 25 MG PO TB24: 50.0000 mg | ORAL_TABLET | Freq: Every day | ORAL | Status: DC

## 2017-06-25 MED ORDER — METFORMIN HCL 850 MG PO TABS: ORAL_TABLET | ORAL | Status: DC

## 2017-06-25 NOTE — Patient Instructions (Addendum)
Stop all nsaids.  Drink plenty of fluid.  Hold metformin for now.  Go to the lab on the way out.  We'll contact you with your lab report. Take care.  Glad to see you.  We'll see about tramadol for hip pain when I see your labs.

## 2017-06-25 NOTE — Progress Notes (Signed)
57 year old diabetic male last seen in May 2018.  He was advised to follow-up 3 to 4 months after that visit.  He presents for follow-up today.  He pulled a muscle in L hip working under his house.  He was using a cane in the meantime.  He has seen ortho, about 1 month ago.  He is in rehab/PT.  He has MRI pending.  He has been taking ibuprofen mult times per day for 6 weeks.  Cr up.  Dw pt. 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  He does not have symptoms otherwise, other than some chronic lower extremity edema that may predate the creatinine elevation.  He still has left hip pain with walking.  Diabetes.  A1c at goal.  Compliant with insulin.  Med list updated.  Labs discussed with patient.  On metformin, d/w pt about holding metformin in the meantime given his creatinine elevation  Prostate cancer screening.  PSA normal.  Discussed with patient.    PMH and SH reviewed  Meds, vitals, and allergies reviewed.   ROS: Per HPI unless specifically indicated in ROS section   GEN: nad, alert and oriented HEENT: mucous membranes moist NECK: supple w/o LA CV: rrr. PULM: ctab, no inc wob ABD: soft, +bs EXT: 1+BLE edema SKIN: no acute rash Walking with a cane, left hip pain causing limp.

## 2017-06-26 ENCOUNTER — Encounter: Payer: Self-pay | Admitting: Family Medicine

## 2017-06-26 ENCOUNTER — Other Ambulatory Visit: Payer: Self-pay | Admitting: Family Medicine

## 2017-06-26 DIAGNOSIS — N179 Acute kidney failure, unspecified: Secondary | ICD-10-CM | POA: Insufficient documentation

## 2017-06-26 DIAGNOSIS — R7989 Other specified abnormal findings of blood chemistry: Secondary | ICD-10-CM

## 2017-06-26 LAB — BASIC METABOLIC PANEL
BUN: 24 mg/dL — AB (ref 6–23)
CHLORIDE: 109 meq/L (ref 96–112)
CO2: 27 meq/L (ref 19–32)
CREATININE: 1.75 mg/dL — AB (ref 0.40–1.50)
Calcium: 9.4 mg/dL (ref 8.4–10.5)
GFR: 43.11 mL/min — ABNORMAL LOW (ref >60.00)
Glucose, Bld: 83 mg/dL (ref 70–99)
Potassium: 4.7 mEq/L (ref 3.5–5.1)
Sodium: 144 mEq/L (ref 135–145)

## 2017-06-26 MED ORDER — TRAMADOL HCL 50 MG PO TABS: 50.0000 mg | ORAL_TABLET | Freq: Two times a day (BID) | ORAL | 0 refills | Status: DC | PRN

## 2017-06-26 NOTE — Assessment & Plan Note (Signed)
Hold metformin for now.  Discussed with patient.  See notes on follow-up labs.

## 2017-06-26 NOTE — Assessment & Plan Note (Signed)
Likely NSAID induced acute vs chronic renal failure.  Labs discussed with patient. Stop all nsaids.  Drink plenty of fluid.  Hold metformin for now.  Recheck labs today.  I will see about possibly prescribing tramadol for pain based on his creatinine clearance when I see his labs.  He is going to need routine follow-up regarding his chronic medical conditions.  I again encouraged him to keep routine follow-up appointments.  We have discussed this multiple times in the past and again today. >25 minutes spent in face to face time with patient, >50% spent in counselling or coordination of care.

## 2017-06-29 ENCOUNTER — Other Ambulatory Visit: Payer: Self-pay | Admitting: Family Medicine

## 2017-06-29 NOTE — Telephone Encounter (Signed)
Sent. Thanks.   

## 2017-06-29 NOTE — Telephone Encounter (Signed)
Electronic refill request Last refill 05/02/16 #90/3 Last office visit 06/25/17

## 2017-06-30 ENCOUNTER — Telehealth: Payer: Self-pay

## 2017-06-30 MED ORDER — TRAMADOL HCL 50 MG PO TABS: 50.0000 mg | ORAL_TABLET | Freq: Two times a day (BID) | ORAL | 0 refills | Status: DC | PRN

## 2017-06-30 NOTE — Telephone Encounter (Signed)
Sent to [facility].  .  I thought this was sent to the pharmacy of his choice.  Thanks.

## 2017-06-30 NOTE — Telephone Encounter (Signed)
I spoke with Patrick Watts at [facility] and tramadol already shipped with estimated delivery at pts home either 06/30/17 or 07/01/17; probably will be delivered on 07/01/17. Pt notified and voiced understanding but hesitated and then said he guessed he would have to hurt another day, that is why pt wanted tramadol to go to [facility]. Pt request cb to see if anything can be done to get some tramadol to [facility] today.

## 2017-06-30 NOTE — Telephone Encounter (Signed)
Copied from CRM [id number]. Topic: Quick Communication - See Telephone Encounter >> Jun 30, 2017 10:12 AM Herby AbrahamJohnson, Shiquita C wrote: CRM for notification. See Telephone encounter for: 06/30/17.  Rx for tramadol should have been sent to local [facility] on [address] in [place]. Pt says that he use mail order for his insulin only. Please assist further.

## 2017-06-30 NOTE — Telephone Encounter (Signed)
Patient advised.

## 2017-07-02 ENCOUNTER — Other Ambulatory Visit: Payer: Self-pay | Admitting: Family Medicine

## 2017-07-14 ENCOUNTER — Other Ambulatory Visit (INDEPENDENT_AMBULATORY_CARE_PROVIDER_SITE_OTHER): Payer: Managed Care, Other (non HMO)

## 2017-07-14 DIAGNOSIS — R7989 Other specified abnormal findings of blood chemistry: Secondary | ICD-10-CM | POA: Diagnosis not present

## 2017-07-15 ENCOUNTER — Ambulatory Visit: Payer: Managed Care, Other (non HMO) | Admitting: Family Medicine

## 2017-07-15 ENCOUNTER — Other Ambulatory Visit (INDEPENDENT_AMBULATORY_CARE_PROVIDER_SITE_OTHER): Payer: Managed Care, Other (non HMO)

## 2017-07-15 ENCOUNTER — Encounter: Payer: Self-pay | Admitting: Family Medicine

## 2017-07-15 VITALS — BP 160/84 | HR 64 | Temp 98.0°F | Ht 68.5 in | Wt 290.2 lb

## 2017-07-15 DIAGNOSIS — M84353D Stress fracture, unspecified femur, subsequent encounter for fracture with routine healing: Secondary | ICD-10-CM | POA: Diagnosis not present

## 2017-07-15 DIAGNOSIS — F4010 Social phobia, unspecified: Secondary | ICD-10-CM | POA: Diagnosis not present

## 2017-07-15 DIAGNOSIS — N179 Acute kidney failure, unspecified: Secondary | ICD-10-CM

## 2017-07-15 LAB — BASIC METABOLIC PANEL
BUN: 23 mg/dL (ref 6–23)
CHLORIDE: 108 meq/L (ref 96–112)
CO2: 26 meq/L (ref 19–32)
CREATININE: 1.61 mg/dL — AB (ref 0.40–1.50)
Calcium: 9.3 mg/dL (ref 8.4–10.5)
GFR: 47.45 mL/min — ABNORMAL LOW (ref >60.00)
Glucose, Bld: 122 mg/dL — ABNORMAL HIGH (ref 70–99)
Potassium: 4.7 mEq/L (ref 3.5–5.1)
SODIUM: 141 meq/L (ref 135–145)

## 2017-07-15 LAB — VITAMIN D 25 HYDROXY (VIT D DEFICIENCY, FRACTURES): VITD: 11.21 ng/mL — AB (ref 30.00–100.00)

## 2017-07-15 MED ORDER — TRAMADOL HCL 50 MG PO TABS: 50.0000 mg | ORAL_TABLET | Freq: Two times a day (BID) | ORAL | 0 refills | Status: DC | PRN

## 2017-07-15 MED ORDER — METOPROLOL TARTRATE 25 MG PO TABS: ORAL_TABLET | ORAL | 0 refills | Status: DC

## 2017-07-15 NOTE — Progress Notes (Signed)
Prev baseline Cr was 1.4s.  Not back to 1.6.  No nsaids in the last 2 weeks.  He is off metformin.  Sugar has been about 120s, diet dependent.    Still with joint pain and walking with a cane.  MRI with stress fracture in femur per ortho and plan is for observation.  I'll defer, d/w pt.  His wife is having to help out at home.  He is using a walker at work.  All of that is a change and a sig adjustment for him.  He isn't overtly depressed but he is annoyed with the limitations.  He does see some improvement.  We talked about checking a vit D level.  We talked about tramadol use, as needed.   We talked about metoprolol tartrate use, prn.  He uses it for public speaking.  Reasonable to use as needed.  L 3rd PIP locking occ now.    His daughter is going to [employer] next fall.    Meds, vitals, and allergies reviewed.   ROS: Per HPI unless specifically indicated in ROS section   GEN: nad, alert and oriented HEENT: mucous membranes moist NECK: supple w/o LA CV: rrr PULM: ctab, no inc wob ABD: soft, +bs EXT: trace edema SKIN: well perfused.  L 3rd PIP triggering noted on exam.  He wanted to defer treatment until his leg was addressed.   Diabetic foot exam: Normal inspection No skin breakdown No calluses  Normal DP pulses Normal sensation to light touch and monofilament Nails normal  Walking with cane.

## 2017-07-15 NOTE — Patient Instructions (Signed)
Assuming your sugar is fine in the near future, then continue as is and recheck in about 3 months, labs ahead of time if possible.    Avoid nsaids.   We'll contact you with your lab report, vitamin D level.  Take care.  Glad to see you.

## 2017-07-17 ENCOUNTER — Other Ambulatory Visit: Payer: Self-pay | Admitting: Family Medicine

## 2017-07-17 DIAGNOSIS — M84353D Stress fracture, unspecified femur, subsequent encounter for fracture with routine healing: Secondary | ICD-10-CM | POA: Insufficient documentation

## 2017-07-17 DIAGNOSIS — F4010 Social phobia, unspecified: Secondary | ICD-10-CM | POA: Insufficient documentation

## 2017-07-17 DIAGNOSIS — E559 Vitamin D deficiency, unspecified: Secondary | ICD-10-CM

## 2017-07-17 MED ORDER — VITAMIN D (ERGOCALCIFEROL) 1.25 MG (50000 UNIT) PO CAPS: 50000.0000 [IU] | ORAL_CAPSULE | ORAL | 0 refills | Status: DC

## 2017-07-17 NOTE — Assessment & Plan Note (Signed)
See notes on labs about vitamin D.  Start replacement.  We can recheck later on.

## 2017-07-17 NOTE — Assessment & Plan Note (Signed)
Okay to use short acting metoprolol tartrate as needed prior to public speaking.

## 2017-07-17 NOTE — Assessment & Plan Note (Signed)
Creatinine is improved.  Discussed with patient. Assuming his sugar is fine in the near future on home check, then continue as is and recheck in about 3 months, labs ahead of time if possible.    Avoid nsaids.   He agrees.  >25 minutes spent in face to face time with patient, >50% spent in counselling or coordination of care.

## 2017-08-15 ENCOUNTER — Other Ambulatory Visit: Payer: Self-pay | Admitting: Family Medicine

## 2017-08-17 NOTE — Telephone Encounter (Signed)
Electronic refill request LOV 07/17/17 Last refill 07/15/17 #30

## 2017-08-18 NOTE — Telephone Encounter (Signed)
Eprescribed.

## 2017-08-27 ENCOUNTER — Other Ambulatory Visit: Payer: Self-pay | Admitting: Family Medicine

## 2017-09-02 ENCOUNTER — Other Ambulatory Visit: Payer: Self-pay | Admitting: Family Medicine

## 2017-09-03 ENCOUNTER — Other Ambulatory Visit: Payer: Self-pay | Admitting: Family Medicine

## 2017-10-12 ENCOUNTER — Other Ambulatory Visit: Payer: Self-pay | Admitting: Family Medicine

## 2017-10-12 ENCOUNTER — Encounter: Payer: Self-pay | Admitting: *Deleted

## 2017-10-23 ENCOUNTER — Other Ambulatory Visit: Payer: Self-pay | Admitting: Family Medicine

## 2017-11-16 ENCOUNTER — Inpatient Hospital Stay (HOSPITAL_COMMUNITY): Payer: Managed Care, Other (non HMO)

## 2017-11-16 ENCOUNTER — Inpatient Hospital Stay (HOSPITAL_COMMUNITY)
Admission: EM | Admit: 2017-11-16 | Discharge: 2017-12-13 | DRG: 064 | Disposition: E | Payer: Managed Care, Other (non HMO) | Attending: Neurology | Admitting: Neurology

## 2017-11-16 ENCOUNTER — Other Ambulatory Visit: Payer: Self-pay

## 2017-11-16 ENCOUNTER — Emergency Department (HOSPITAL_COMMUNITY): Payer: Managed Care, Other (non HMO)

## 2017-11-16 ENCOUNTER — Encounter (HOSPITAL_COMMUNITY): Payer: Self-pay | Admitting: Emergency Medicine

## 2017-11-16 DIAGNOSIS — Z7982 Long term (current) use of aspirin: Secondary | ICD-10-CM

## 2017-11-16 DIAGNOSIS — Z886 Allergy status to analgesic agent status: Secondary | ICD-10-CM

## 2017-11-16 DIAGNOSIS — Z8249 Family history of ischemic heart disease and other diseases of the circulatory system: Secondary | ICD-10-CM | POA: Diagnosis not present

## 2017-11-16 DIAGNOSIS — N179 Acute kidney failure, unspecified: Secondary | ICD-10-CM | POA: Diagnosis present

## 2017-11-16 DIAGNOSIS — E1165 Type 2 diabetes mellitus with hyperglycemia: Secondary | ICD-10-CM | POA: Diagnosis present

## 2017-11-16 DIAGNOSIS — G9382 Brain death: Secondary | ICD-10-CM | POA: Diagnosis present

## 2017-11-16 DIAGNOSIS — N183 Chronic kidney disease, stage 3 (moderate): Secondary | ICD-10-CM | POA: Diagnosis present

## 2017-11-16 DIAGNOSIS — I161 Hypertensive emergency: Secondary | ICD-10-CM | POA: Diagnosis present

## 2017-11-16 DIAGNOSIS — G935 Compression of brain: Secondary | ICD-10-CM | POA: Diagnosis present

## 2017-11-16 DIAGNOSIS — I615 Nontraumatic intracerebral hemorrhage, intraventricular: Principal | ICD-10-CM | POA: Diagnosis present

## 2017-11-16 DIAGNOSIS — Z452 Encounter for adjustment and management of vascular access device: Secondary | ICD-10-CM

## 2017-11-16 DIAGNOSIS — G936 Cerebral edema: Secondary | ICD-10-CM | POA: Diagnosis present

## 2017-11-16 DIAGNOSIS — E1122 Type 2 diabetes mellitus with diabetic chronic kidney disease: Secondary | ICD-10-CM | POA: Diagnosis present

## 2017-11-16 DIAGNOSIS — I610 Nontraumatic intracerebral hemorrhage in hemisphere, subcortical: Secondary | ICD-10-CM | POA: Diagnosis not present

## 2017-11-16 DIAGNOSIS — Z833 Family history of diabetes mellitus: Secondary | ICD-10-CM | POA: Diagnosis not present

## 2017-11-16 DIAGNOSIS — I129 Hypertensive chronic kidney disease with stage 1 through stage 4 chronic kidney disease, or unspecified chronic kidney disease: Secondary | ICD-10-CM | POA: Diagnosis present

## 2017-11-16 DIAGNOSIS — J9601 Acute respiratory failure with hypoxia: Secondary | ICD-10-CM | POA: Diagnosis present

## 2017-11-16 DIAGNOSIS — Z6841 Body Mass Index (BMI) 40.0 and over, adult: Secondary | ICD-10-CM

## 2017-11-16 DIAGNOSIS — E232 Diabetes insipidus: Secondary | ICD-10-CM | POA: Diagnosis present

## 2017-11-16 DIAGNOSIS — M109 Gout, unspecified: Secondary | ICD-10-CM | POA: Diagnosis present

## 2017-11-16 DIAGNOSIS — Z841 Family history of disorders of kidney and ureter: Secondary | ICD-10-CM | POA: Diagnosis not present

## 2017-11-16 DIAGNOSIS — Z4659 Encounter for fitting and adjustment of other gastrointestinal appliance and device: Secondary | ICD-10-CM

## 2017-11-16 DIAGNOSIS — G911 Obstructive hydrocephalus: Secondary | ICD-10-CM | POA: Diagnosis present

## 2017-11-16 DIAGNOSIS — Z0189 Encounter for other specified special examinations: Secondary | ICD-10-CM

## 2017-11-16 DIAGNOSIS — N289 Disorder of kidney and ureter, unspecified: Secondary | ICD-10-CM | POA: Diagnosis not present

## 2017-11-16 DIAGNOSIS — E785 Hyperlipidemia, unspecified: Secondary | ICD-10-CM | POA: Diagnosis present

## 2017-11-16 DIAGNOSIS — G8190 Hemiplegia, unspecified affecting unspecified side: Secondary | ICD-10-CM | POA: Diagnosis present

## 2017-11-16 DIAGNOSIS — I10 Essential (primary) hypertension: Secondary | ICD-10-CM

## 2017-11-16 DIAGNOSIS — I619 Nontraumatic intracerebral hemorrhage, unspecified: Secondary | ICD-10-CM | POA: Diagnosis present

## 2017-11-16 DIAGNOSIS — Z8042 Family history of malignant neoplasm of prostate: Secondary | ICD-10-CM

## 2017-11-16 DIAGNOSIS — Z87442 Personal history of urinary calculi: Secondary | ICD-10-CM | POA: Diagnosis not present

## 2017-11-16 DIAGNOSIS — I9589 Other hypotension: Secondary | ICD-10-CM | POA: Diagnosis not present

## 2017-11-16 LAB — COMPREHENSIVE METABOLIC PANEL
ALT: 25 U/L (ref 0–44)
AST: 28 U/L (ref 15–41)
Albumin: 3.4 g/dL — ABNORMAL LOW (ref 3.5–5.0)
Alkaline Phosphatase: 89 U/L (ref 38–126)
Anion gap: 7 (ref 5–15)
BILIRUBIN TOTAL: 0.8 mg/dL (ref 0.3–1.2)
BUN: 14 mg/dL (ref 6–20)
CO2: 23 mmol/L (ref 22–32)
Calcium: 8.8 mg/dL — ABNORMAL LOW (ref 8.9–10.3)
Chloride: 110 mmol/L (ref 98–111)
Creatinine, Ser: 1.65 mg/dL — ABNORMAL HIGH (ref 0.61–1.24)
GFR calc Af Amer: 52 mL/min — ABNORMAL LOW (ref >60)
GFR calc non Af Amer: 45 mL/min — ABNORMAL LOW (ref >60)
GLUCOSE: 167 mg/dL — AB (ref 70–99)
POTASSIUM: 3.6 mmol/L (ref 3.5–5.1)
Sodium: 140 mmol/L (ref 135–145)
TOTAL PROTEIN: 8 g/dL (ref 6.5–8.1)

## 2017-11-16 LAB — URINALYSIS, ROUTINE W REFLEX MICROSCOPIC
BACTERIA UA: NONE SEEN
Bilirubin Urine: NEGATIVE
Glucose, UA: 50 mg/dL — AB
KETONES UR: NEGATIVE mg/dL
Leukocytes, UA: NEGATIVE
NITRITE: NEGATIVE
Protein, ur: 100 mg/dL — AB
Specific Gravity, Urine: 1.012 (ref 1.005–1.030)
pH: 5 (ref 5.0–8.0)

## 2017-11-16 LAB — RAPID URINE DRUG SCREEN, HOSP PERFORMED
Amphetamines: NOT DETECTED
BENZODIAZEPINES: NOT DETECTED
Barbiturates: NOT DETECTED
COCAINE: NOT DETECTED
OPIATES: NOT DETECTED
Tetrahydrocannabinol: NOT DETECTED

## 2017-11-16 LAB — CBC WITH DIFFERENTIAL/PLATELET
Abs Immature Granulocytes: 0.16 10*3/uL — ABNORMAL HIGH (ref 0.00–0.07)
Basophils Absolute: 0.1 10*3/uL (ref 0.0–0.1)
Basophils Relative: 1 %
EOS ABS: 0.2 10*3/uL (ref 0.0–0.5)
Eosinophils Relative: 2 %
HEMATOCRIT: 47.4 % (ref 39.0–52.0)
HEMOGLOBIN: 14.9 g/dL (ref 13.0–17.0)
IMMATURE GRANULOCYTES: 2 %
LYMPHS PCT: 20 %
Lymphs Abs: 1.9 10*3/uL (ref 0.7–4.0)
MCH: 29.5 pg (ref 26.0–34.0)
MCHC: 31.4 g/dL (ref 30.0–36.0)
MCV: 93.9 fL (ref 80.0–100.0)
MONOS PCT: 8 %
Monocytes Absolute: 0.8 10*3/uL (ref 0.1–1.0)
NEUTROS PCT: 67 %
Neutro Abs: 6.4 10*3/uL (ref 1.7–7.7)
Platelets: 273 10*3/uL (ref 150–400)
RBC: 5.05 MIL/uL (ref 4.22–5.81)
RDW: 13.1 % (ref 11.5–15.5)
WBC: 9.5 10*3/uL (ref 4.0–10.5)
nRBC: 0 % (ref 0.0–0.2)

## 2017-11-16 LAB — I-STAT ARTERIAL BLOOD GAS, ED
ACID-BASE DEFICIT: 5 mmol/L — AB (ref 0.0–2.0)
BICARBONATE: 20.1 mmol/L (ref 20.0–28.0)
O2 SAT: 95 %
TCO2: 21 mmol/L — ABNORMAL LOW (ref 22–32)
pCO2 arterial: 35.2 mmHg (ref 32.0–48.0)
pH, Arterial: 7.355 (ref 7.350–7.450)
pO2, Arterial: 74 mmHg — ABNORMAL LOW (ref 83.0–108.0)

## 2017-11-16 LAB — MRSA PCR SCREENING: MRSA by PCR: NEGATIVE

## 2017-11-16 LAB — ETHANOL: Alcohol, Ethyl (B): <10 mg/dL (ref <10)

## 2017-11-16 LAB — SODIUM
SODIUM: 141 mmol/L (ref 135–145)
SODIUM: 141 mmol/L (ref 135–145)

## 2017-11-16 LAB — TROPONIN I: Troponin I: <0.03 ng/mL (ref <0.03)

## 2017-11-16 MED ORDER — ACETAMINOPHEN 325 MG PO TABS: 650.0000 mg | ORAL_TABLET | ORAL | Status: DC | PRN

## 2017-11-16 MED ORDER — ETOMIDATE 2 MG/ML IV SOLN
INTRAVENOUS | Status: AC | PRN
  Administered 2017-11-16: 20 mg via INTRAVENOUS

## 2017-11-16 MED ORDER — ORAL CARE MOUTH RINSE
15.0000 mL | OROMUCOSAL | Status: DC
  Administered 2017-11-16 – 2017-11-18 (×16): 15 mL via OROMUCOSAL

## 2017-11-16 MED ORDER — ACETAMINOPHEN 160 MG/5ML PO SOLN
650.0000 mg | ORAL | Status: DC | PRN
  Administered 2017-11-17: 650 mg
  Filled 2017-11-16: qty 20.3

## 2017-11-16 MED ORDER — SENNOSIDES-DOCUSATE SODIUM 8.6-50 MG PO TABS: 1.0000 | ORAL_TABLET | Freq: Two times a day (BID) | ORAL | Status: DC

## 2017-11-16 MED ORDER — PROPOFOL 1000 MG/100ML IV EMUL
INTRAVENOUS | Status: AC
  Filled 2017-11-16: qty 100

## 2017-11-16 MED ORDER — PANTOPRAZOLE SODIUM 40 MG IV SOLR
40.0000 mg | Freq: Every day | INTRAVENOUS | Status: DC
  Administered 2017-11-16: 40 mg via INTRAVENOUS
  Filled 2017-11-16: qty 40

## 2017-11-16 MED ORDER — SODIUM CHLORIDE 3 % IV SOLN
INTRAVENOUS | Status: DC
  Administered 2017-11-16 – 2017-11-17 (×4): 75 mL/h via INTRAVENOUS
  Filled 2017-11-16 (×5): qty 500

## 2017-11-16 MED ORDER — SUCCINYLCHOLINE CHLORIDE 20 MG/ML IJ SOLN
INTRAMUSCULAR | Status: AC | PRN
  Administered 2017-11-16: 125 mg via INTRAVENOUS

## 2017-11-16 MED ORDER — WHITE PETROLATUM EX OINT
TOPICAL_OINTMENT | CUTANEOUS | Status: AC
  Administered 2017-11-16: 18:00:00
  Filled 2017-11-16: qty 28.35

## 2017-11-16 MED ORDER — LIDOCAINE HCL (CARDIAC) PF 100 MG/5ML IV SOSY
PREFILLED_SYRINGE | INTRAVENOUS | Status: AC | PRN
  Administered 2017-11-16: 100 mg via INTRAVENOUS

## 2017-11-16 MED ORDER — MIDAZOLAM HCL 2 MG/2ML IJ SOLN
2.0000 mg | Freq: Once | INTRAMUSCULAR | Status: AC
  Administered 2017-11-16: 2 mg via INTRAVENOUS
  Filled 2017-11-16: qty 2

## 2017-11-16 MED ORDER — ACETAMINOPHEN 650 MG RE SUPP: 650.0000 mg | RECTAL | Status: DC | PRN

## 2017-11-16 MED ORDER — STROKE: EARLY STAGES OF RECOVERY BOOK
Freq: Once | Status: AC
  Administered 2017-11-16: 17:00:00
  Filled 2017-11-16: qty 1

## 2017-11-16 MED ORDER — CHLORHEXIDINE GLUCONATE 0.12% ORAL RINSE (MEDLINE KIT)
15.0000 mL | Freq: Two times a day (BID) | OROMUCOSAL | Status: DC
  Administered 2017-11-16 – 2017-11-18 (×3): 15 mL via OROMUCOSAL

## 2017-11-16 MED ORDER — NICARDIPINE HCL IN NACL 20-0.86 MG/200ML-% IV SOLN
0.0000 mg/h | INTRAVENOUS | Status: DC
  Administered 2017-11-16: 4 mg/h via INTRAVENOUS
  Administered 2017-11-16 (×2): 15 mg/h via INTRAVENOUS
  Administered 2017-11-16: 8 mg/h via INTRAVENOUS
  Administered 2017-11-16: 15 mg/h via INTRAVENOUS
  Administered 2017-11-16: 5 mg/h via INTRAVENOUS
  Administered 2017-11-17 (×2): 15 mg/h via INTRAVENOUS
  Administered 2017-11-17: 12.5 mg/h via INTRAVENOUS
  Administered 2017-11-17 (×2): 15 mg/h via INTRAVENOUS
  Filled 2017-11-16 (×11): qty 200

## 2017-11-16 NOTE — Consult Note (Signed)
Neurosurgery Consultation  Reason for Consult: Intracerebral hemorrhage, intraventricular hemorrhage Referring Physician: Amada Jupiter  CC: Unresponsive  HPI: This is a 57 y.o. man that presents with progressive obtundation. 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 CTH showed large L ICH. No further history available due to patient's depressed mental status. No known use of anti-platelet or anti-coagulant medications.   ROS: A 14 point ROS was performed and is negative except as noted in the HPI, but limited due to pt's depressed mental status.  PMHx:  Past Medical History:  Diagnosis Date  . Adverse effect of non-steroidal anti-inflammatory drug (NSAID)   . Diabetes mellitus   . Elevated serum creatinine    due to nsaids  . Gout   . Hyperlipidemia   . Hypertension   . Kidney stones    recurrent, ER 1996, 08/1999   FamHx:  Family History  Problem Relation Age of Onset  . Diabetes Mother   . Hypertension Mother   . Kidney disease Mother   . Heart disease Mother        CHF  . Kidney disease Father   . Heart disease Father        CHF  . Coronary artery disease Father        CABG x 3  . Prostate cancer Father   . Diabetes Brother   . Colon cancer Neg Hx    SocHx:  reports that he has never smoked. He has never used smokeless tobacco. He reports that he does not drink alcohol or use drugs.  Exam: Vital signs in last 24 hours: Temp:  [95.6 F (35.3 C)] 95.6 F (35.3 C) (11/04 0641) Pulse Rate:  [50-85] 77 (11/04 0845) Resp:  [12-24] 24 (11/04 0845) BP: (118-227)/(55-106) 140/55 (11/04 0845) SpO2:  [90 %-100 %] 98 % (11/04 0845) FiO2 (%):  [60 %] 60 % (11/04 0720) Weight:  [134.5 kg] 134.5 kg (11/04 0705) General: Lying in hospital bed, appears acutely ill Head: normocephalic and atruamatic HEENT: neck supple Pulmonary: intubated, good chest rise bilaterally Cardiac: RRR Abdomen: obese S NT  ND Extremities: warm and well perfused x4 Neuro: Off sedation, no eye opening or motor response to noxious stimulus, pupils fixed and dilated OU 8mm, no corneals, no cough/gag reflexes   Assessment and Plan: 57 y.o. man with hemiparesis progressing to obtundation. CTH personally reviewed, which shows a very large left BG ICH with IVH with brain compression and midline shift. Hemorrhage measures roughly 100cc, ICH score of 4, no brainstem reflexes on exam.  -no acute neurosurgical intervention indicated at this time -very large hemorrhage without brainstem reflexes. Unfortunately, this appears to be a fatal injury. I discussed his very poor prognosis and the lack of indication for surgical intervention with the family at bedside.   Jadene Pierini, MD 11/14/2017 9:05 AM [facility]

## 2017-11-16 NOTE — Progress Notes (Signed)
I completed a follow up visit referred by the On-Call Chaplain to provide spiritual support for the patient's family. I visited with the patient and his wife and daughter in [place]. I provided spiritual support by sharing words of encouragement and led in prayer. I shared that the Chaplain is available for additional support as needed or requested.    12/07/2017 0900  Clinical Encounter Type  Visited With Patient and family together  Visit Type Spiritual support;ED  Referral From Nurse  Consult/Referral To Chaplain  Spiritual Encounters  Spiritual Needs Prayer;Emotional  Stress Factors  Patient Stress Factors None identified  Family Stress Factors Exhausted    Chaplain Dr Melvyn Novas

## 2017-11-16 NOTE — ED Notes (Addendum)
Family at bedside. Pt still unresponsive, not on sedation. Cardene at 15mg /hr, BP under control. Family is tearful. Neurosurgeon now at bedside.

## 2017-11-16 NOTE — H&P (Addendum)
Neurology H&P  CC: Unresponsiveness  History is obtained from: Family  HPI: Patrick Watts is a 57 y.o. male with a history of hypertension, hyperlipidemia, diabetes who presents with large parenchymal hemorrhage.  He was in his normal state of health last night, and was getting ready for work this morning, though his wife did not see him.  At 5:10, she heard him fall and went to check on him and found him not moving his right side and try to move himself with the left side.  On arrival, he was seen to be having decerebrate posturing.  EMS brought him in emergently where he was intubated and taken for head CT.  Head CT showed a large subcortical intraparenchymal hemorrhage with intraventricular extension.  I discussed his case with Dr. Johnsie Cancel of neurosurgery  LKW: Last night prior to bed tpa given?: no, ich ICH Score: 4 Modified Rankin Scale: 0-Completely asymptomatic and back to baseline post- stroke  ROS: Unable to obtain due to altered mental status.   Past Medical History:  Diagnosis Date  . Adverse effect of non-steroidal anti-inflammatory drug (NSAID)   . Diabetes mellitus   . Elevated serum creatinine    due to nsaids  . Gout   . Hyperlipidemia   . Hypertension   . Kidney stones    recurrent, ER 1996, 08/1999     Family History  Problem Relation Age of Onset  . Diabetes Mother   . Hypertension Mother   . Kidney disease Mother   . Heart disease Mother        CHF  . Kidney disease Father   . Heart disease Father        CHF  . Coronary artery disease Father        CABG x 3  . Prostate cancer Father   . Diabetes Brother   . Colon cancer Neg Hx      Social History:  reports that he has never smoked. He has never used smokeless tobacco. He reports that he does not drink alcohol or use drugs.   Exam: Current vital signs: BP (!) 167/72   Pulse 80   Temp (!) 95.6 F (35.3 C) (Temporal)   Resp 18   Ht 5\' 7"  (1.702 m)   Wt 134.5 kg   SpO2 100%   BMI  46.44 kg/m  Vital signs in last 24 hours: Temp:  [95.6 F (35.3 C)] 95.6 F (35.3 C) (11/04 0641) Pulse Rate:  [50-85] 80 (11/04 0740) Resp:  [12-21] 18 (11/04 0740) BP: (118-227)/(70-106) 167/72 (11/04 0740) SpO2:  [90 %-100 %] 100 % (11/04 0740) FiO2 (%):  [60 %] 60 % (11/04 0720) Weight:  [134.5 kg] 134.5 kg (11/04 0705)  Physical Exam  Constitutional: Appears obese Psych: Affect appropriate to situation Eyes: No scleral injection HENT: No OP obstrucion Head: Normocephalic.  Cardiovascular: Normal rate and regular rhythm.  Respiratory: Effort normal and breath sounds normal to anterior ascultation GI: Soft.  No distension. There is no tenderness.  Skin: WDI  Neuro: Mental Status: Patient is comatose, does not open eyes or follow commands. Cranial Nerves: II: Does not blink to threat, eyes are 6 mm bilaterally with minimal reactivity in the left eye and?  Reactivity of the right eye  III,IV, VI: No response to doll's eye maneuver V: VII: Corneals are absent bilaterally Motor: He has flexion posturing of the left upper extremity noxious stimuli, no movement to other extremities Sensory: Does not perform Cerebellar: Does not perform  I  have reviewed labs in epic and the results pertinent to this consultation are: CBC - unremarkable  I have reviewed the images obtained: This large hemorrhage  Primary Diagnosis:  ICH   Secondary Diagnosis: Cerebral edema and Accelerated hypertension(SBP > 180 or DBP > 11) & end organ damage) coma   Impression: 57 year old male with ICH score four predicting 97% mortality from a large ganglia hemorrhage.  At this point I discussed with neurosurgery who does not feel that surgical intervention is likely to be of benefit to the patient.  I discussed this with the family, at this time they still wish for aggressive care.  Recommendations: 1) Admit to ICU 2) no antiplatelets or anticoagulants 3) blood pressure control with goal  systolic 120 - 140, nicardipine 4) Frequent neuro checks 5) If symptoms worsen or there is decreased mental status, repeat stat head CT 6) PT,OT,ST 7) appreciate neurosurgical consultation   This patient is critically ill and at significant risk of neurological worsening, death and care requires constant monitoring of vital signs, hemodynamics,respiratory and cardiac monitoring, neurological assessment, discussion with family, other specialists and medical decision making of high complexity. I spent 60 minutes of neurocritical care time  in the care of  this patient.  Ritta Slot, MD [facility] [phone]  If 7pm- 7am, please page neurology on call as listed in AMION. 11/26/2017  8:22 AM

## 2017-11-16 NOTE — ED Notes (Addendum)
Dr Amada Jupiter at bedside. Pt is unresponsive, on no sedation. BP being controlled with cardene drip. HOB elevated to 30 degrees

## 2017-11-16 NOTE — ED Provider Notes (Signed)
[facility] EMERGENCY DEPARTMENT Provider Note   CSN: [id number] Arrival date & time: 12/08/2017  0630     History   Chief Complaint Unresponsive  HPI Patrick Watts is a 57 y.o. male.  The history is provided by the EMS personnel and the spouse. The history is limited by the condition of the patient (Unresponsive).  He took a shower this morning, and his wife heard him fall.  When she got to the bathroom, he was trying to lift himself up with his left hand, and was trying to scoot by pushing himself on the floor with his right hand.  She was finally able to get past the door to get into the bathroom where he had fallen and noted that he was diaphoretic.  EMS was called and noted that he was unresponsive.  The inserted bilateral nasal trumpets, which he did tolerate.  He does have a known history of hypertension, and has been compliant with his medications.  He is a non-smoker and does not use illicit drugs.  He did have a wrist fracture of his left femur 6 months ago, and was recently released from physical therapy.  Past history includes diabetes, hypertension, hyperlipidemia, gout.  Past Medical History:  Diagnosis Date  . Adverse effect of non-steroidal anti-inflammatory drug (NSAID)   . Diabetes mellitus   . Elevated serum creatinine    due to nsaids  . Gout   . Hyperlipidemia   . Hypertension   . Kidney stones    recurrent, ER 1996, 08/1999    Patient Active Problem List   Diagnosis Date Noted  . Stress fracture of femur with routine healing 07/17/2017  . 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 ARF (acute renal failure) ([place]) 06/26/2017  . Erectile dysfunction 09/30/2013  . Obesity 02/12/2011  . HLD (hyperlipidemia) 05/23/2010  . GERD 03/18/2007  . 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 Gout 08/04/2006  . HYPERTENSION, BENIGN ESSENTIAL 08/04/2006    No past surgical history on  file.      Home Medications    Prior to Admission medications   Medication Sig Start Date End Date Taking? Authorizing Provider  allopurinol (ZYLOPRIM) 300 MG tablet TAKE 1 TABLET BY MOUTH ONCE DAILY 07/02/17   Tonia Ghent, MD  colchicine (COLCRYS) 0.6 MG tablet TAKE ONE TABLET BY MOUTH TWICE DAILY AS NEEDED FOR GOUTY SYMPTOMS. 09/23/15   Tonia Ghent, MD  [employer] ALPHA LIPOIC ACID PO Take 250 mg by mouth daily.    [provider]  gabapentin (NEURONTIN) 300 MG capsule TAKE 1 TO 3 CAPSULES BY MOUTH AT BEDTIME 06/29/17   Tonia Ghent, MD  Insulin Glargine (LANTUS SOLOSTAR) 100 UNIT/ML Solostar Pen Take 68 units in am and 22 units in pm 06/25/17   Tonia Ghent, MD  Insulin Glargine (LANTUS SOLOSTAR) 100 UNIT/ML Solostar Pen Inject 0.52 mLs (52 Units dose) into the skin every morning AND 0.2 mLs (20 Units dose) every evening. 09/03/17   Tonia Ghent, MD  Insulin Pen Needle (BD PEN NEEDLE NANO U/F) 32G X 4 MM MISC USE TWICE DAILY WITH INSULIN INJECTION. 09/03/17   Tonia Ghent, MD  lisinopril (PRINIVIL,ZESTRIL) 20 MG tablet TAKE 1 TABLET BY MOUTH TWICE DAILY 09/03/17   Tonia Ghent, MD  metoprolol succinate (TOPROL-XL) 25 MG 24 hr tablet Take 2 tablets (50 mg total) by mouth daily. 06/25/17   Tonia Ghent, MD  metoprolol succinate (  TOPROL-XL) 25 MG 24 hr tablet TAKE 1 TO 2 TABLETS BY MOUTH ONCE DAILY 10/12/17   Tonia Ghent, MD  metoprolol tartrate (LOPRESSOR) 25 MG tablet TAKE 1 TABLET BY MOUTH ONCE DAILY AS NEEDED FOR PUBLIC SPEAKING 26/37/85   Tonia Ghent, MD  omeprazole (PRILOSEC OTC) 20 MG tablet daily.     [provider]  pioglitazone (ACTOS) 45 MG tablet TAKE 1 TABLET BY MOUTH ONCE DAILY 09/02/17   Tonia Ghent, MD  pravastatin (PRAVACHOL) 40 MG tablet TAKE 2 TABLETS BY MOUTH AT BEDTIME 07/02/17   Tonia Ghent, MD  prednisoLONE acetate (PRED MILD) 0.12 % ophthalmic suspension Place 1 drop into both eyes 2 (two) times daily.    [provider]  sildenafil (VIAGRA) 100 MG tablet Take 0.5-1 tablets (50-100 mg total) by mouth daily as needed for erectile dysfunction. 09/29/13   Tonia Ghent, MD  traMADol (ULTRAM) 50 MG tablet TAKE 1 TABLET BY MOUTH EVERY 12 HOURS AS NEEDED FOR PAIN 08/18/17   Ria Bush, MD  Vitamin D, Ergocalciferol, (DRISDOL) 50000 units CAPS capsule Take 1 capsule (50,000 Units total) by mouth every 7 (seven) days. 07/17/17   Tonia Ghent, MD    Family History Family History  Problem Relation Age of Onset  . Diabetes Mother   . Hypertension Mother   . Kidney disease Mother   . Heart disease Mother        CHF  . Kidney disease Father   . Heart disease Father        CHF  . Coronary artery disease Father        CABG x 3  . Prostate cancer Father   . Diabetes Brother   . Colon cancer Neg Hx     Social History Social History   Tobacco Use  . Smoking status: Never Smoker  . Smokeless tobacco: Never Used  Substance Use Topics  . Alcohol use: No    Alcohol/week: 0.0 standard drinks  . Drug use: No     Allergies   Ibuprofen   Review of Systems Review of Systems  Unable to perform ROS: Patient unresponsive     Physical Exam Updated Vital Signs BP (!) 172/106   Pulse 67   Temp (!) 95.6 F (35.3 C) (Temporal)   Resp 17   SpO2 100%   Physical Exam  Nursing note and vitals reviewed.  Morbidly obese 57 year old male, resting comfortably and in no acute distress. Vital signs are significant for markedly elevated blood pressure. Oxygen saturation is 100%, which is normal. Head is normocephalic and atraumatic.  Right pupil is 3 mm, left pupil 4 mm with minimal reaction to light. Bilateral nasal trumpets are in place. Oropharynx is clear.  There is no gag reflex.  There is no corneal reflex.  Fundi show marked AV crossing defects, scattered retinal hemorrhages. Neck is supple without adenopathy or JVD. Lungs are clear without rales, wheezes, or rhonchi. Chest has  symmetric movement. Heart has regular rate and rhythm without murmur. Abdomen is soft, flat without masses or hepatosplenomegaly. Extremities have trace edema, full range of motion is present. Skin is warm and dry without rash. Neurologic: He is unresponsive to even deep painful stimuli.  However, on attempt to intubate him without sedation, he did demonstrate decerebrate posturing.  Glasgow Coma Scale is 5.  There is no Babinski reflexes.  ED Treatments / Results  Labs (all labs ordered are listed, but only abnormal results are displayed)  Labs Reviewed  COMPREHENSIVE METABOLIC PANEL  TROPONIN I  CBC WITH DIFFERENTIAL/PLATELET  ETHANOL  URINALYSIS, ROUTINE W REFLEX MICROSCOPIC  RAPID URINE DRUG SCREEN, HOSP PERFORMED  BLOOD GAS, ARTERIAL    EKG EKG Interpretation  Date/Time:  Monday November 16 2017 06:47:25 EST Ventricular Rate:  68 PR Interval:    QRS Duration: 95 QT Interval:  422 QTC Calculation: 449 R Axis:   39 Text Interpretation:  Sinus rhythm Abnormal R-wave progression, early transition No old tracing to compare Confirmed by Delora Fuel ([id number]) on 12/01/2017 7:01:43 AM   Radiology Dg Chest Port 1 View  Result Date: 12/10/2017 CLINICAL DATA:  Endotracheal tube placement. EXAM: PORTABLE CHEST 1 VIEW COMPARISON:  None. FINDINGS: Endotracheal tube tip is above the sternal notch 9.5 cm above the carina. Enteric tube courses into the region of the stomach and off the inferior portion of the film as tip is not visualized. Lungs are adequately inflated with linear density over the right midlung likely atelectasis. There is minimal prominence of the perihilar markings suggesting mild vascular congestion. Mild cardiomegaly. Degenerative change of the spine. IMPRESSION: Mild cardiomegaly with suggestion mild vascular congestion. Linear density right midlung likely atelectasis. Infection in right perihilar region is possible. Endotracheal tube has tip 9.5 cm above the carina.  Enteric tube courses into the region of the stomach and off the film as tip is not visualized. Electronically Signed   By: Marin Olp M.D.   On: 11/26/2017 07:19    Procedures Procedure Name: Intubation Date/Time: 12/11/2017 6:50 AM Performed by: Delora Fuel, MD Pre-anesthesia Checklist: Patient identified, Patient being monitored, Emergency Drugs available, Timeout performed and Suction available Oxygen Delivery Method: Non-rebreather mask Preoxygenation: Pre-oxygenation with 100% oxygen Induction Type: Rapid sequence Ventilation: Mask ventilation without difficulty Laryngoscope Size: Mac, 4 and Glidescope Grade View: Grade III Tube size: 8.0 mm Number of attempts: 1 Airway Equipment and Method: Rigid stylet and Video-laryngoscopy Placement Confirmation: ETT inserted through vocal cords under direct vision,  CO2 detector and Breath sounds checked- equal and bilateral Secured at: 22 cm Tube secured with: ETT holder Dental Injury: Teeth and Oropharynx as per pre-operative assessment  Difficulty Due To: Difficulty was unanticipated Comments: Anatomy of pharynx and vocal cords was markedly distorted, very small opening for the cords which was difficult to identify behind a floppy epiglottis.       CRITICAL CARE Performed by: Delora Fuel Total critical care time: 95 minutes Critical care time was exclusive of separately billable procedures and treating other patients. Critical care was necessary to treat or prevent imminent or life-threatening deterioration. Critical care was time spent personally by me on the following activities: development of treatment plan with patient and/or surrogate as well as nursing, discussions with consultants, evaluation of patient's response to treatment, examination of patient, obtaining history from patient or surrogate, ordering and performing treatments and interventions, ordering and review of laboratory studies, ordering and review of radiographic  studies, pulse oximetry and re-evaluation of patient's condition.  Medications Ordered in ED Medications  lidocaine (cardiac) 100 mg/52m (XYLOCAINE) injection 2% (100 mg Intravenous Given 12/08/2017 0646)  etomidate (AMIDATE) injection (20 mg Intravenous Given 11/22/2017 0647)  succinylcholine (ANECTINE) injection (125 mg Intravenous Given 12/10/2017 0647)  nicardipine (CARDENE) 23min 0.86% saline 20026mV infusion (0.1 mg/ml) (5 mg/hr Intravenous New Bag/Given 12/08/2017 0701)     Initial Impression / Assessment and Plan / ED Course  I have reviewed the triage vital signs and the nursing notes.  Pertinent labs & imaging results that  were available during my care of the patient were reviewed by me and considered in my medical decision making (see chart for details).  Collapse with hypertension strongly suggestive of basal ganglia bleed.  Patient was intubated because of low GCS score and need to protect airway.  Intubation was difficult, but successful.  He was started on nicardipine for blood pressure control.  I have explained to the family the seriousness of his condition.  CT of the head is pending.  CT shows large right-sided intracerebral hemorrhage with midline shift.  Case has been discussed with Dr. Leonel Ramsay of neurology service who is coming to see the patient.  Case has been discussed with patient's spouse.  Very poor prognosis was stressed.  Dr. Leonel Ramsay is discussed with neurosurgery and no surgical intervention is indicated.  He will be admitted to the neurology service.  Of note, labs do show proteinuria consistent with hypertensive nephropathy.  Mild to moderate renal insufficiency is present.  Final Clinical Impressions(s) / ED Diagnoses   Final diagnoses:  Hemorrhagic stroke [facility])  Severe hypertension  Renal insufficiency    ED Discharge Orders    None       Delora Fuel, MD [birth year] [phone]

## 2017-11-16 NOTE — Progress Notes (Signed)
   2017-12-06 0700  Clinical Encounter Type  Visited With Health care provider;Family  Visit Type Initial;Critical Care;ED  Referral From Physician  Spiritual Encounters  Spiritual Needs Emotional  CH paged to offer support to family and to assist MD in notification to family of diagnosis

## 2017-11-16 NOTE — ED Notes (Addendum)
Patrick Fleeting, MD attempting intubation. Successful with color change and lung sounds.

## 2017-11-16 NOTE — Progress Notes (Signed)
Pt transported from ED, Trauma A, to CT and back without incident.

## 2017-11-16 NOTE — Progress Notes (Signed)
Pt transported from ED TRAA to 4N31 without incident.

## 2017-11-16 NOTE — Progress Notes (Signed)
OT Cancellation Note  Patient Details Name: Patrick Watts MRN: [id number] DOB: Jun 26, 1960   Cancelled Treatment:    Reason Eval/Treat Not Completed: Active bedrest order  [name], OT/L   Acute OT Clinical Specialist Acute Rehabilitation Services Pager [phone] Office [phone]  12/09/2017, 2:15 PM

## 2017-11-17 DIAGNOSIS — I615 Nontraumatic intracerebral hemorrhage, intraventricular: Principal | ICD-10-CM

## 2017-11-17 DIAGNOSIS — N289 Disorder of kidney and ureter, unspecified: Secondary | ICD-10-CM

## 2017-11-17 DIAGNOSIS — I10 Essential (primary) hypertension: Secondary | ICD-10-CM

## 2017-11-17 DIAGNOSIS — G935 Compression of brain: Secondary | ICD-10-CM

## 2017-11-17 DIAGNOSIS — G936 Cerebral edema: Secondary | ICD-10-CM

## 2017-11-17 DIAGNOSIS — G911 Obstructive hydrocephalus: Secondary | ICD-10-CM

## 2017-11-17 DIAGNOSIS — G9382 Brain death: Secondary | ICD-10-CM

## 2017-11-17 LAB — POCT I-STAT 3, ART BLOOD GAS (G3+)
Acid-base deficit: 6 mmol/L — ABNORMAL HIGH (ref 0.0–2.0)
BICARBONATE: 20.5 mmol/L (ref 20.0–28.0)
O2 Saturation: 96 %
PO2 ART: 87 mmHg (ref 83.0–108.0)
Patient temperature: 98.6
TCO2: 22 mmol/L (ref 22–32)
pCO2 arterial: 41.4 mmHg (ref 32.0–48.0)
pH, Arterial: 7.302 — ABNORMAL LOW (ref 7.350–7.450)

## 2017-11-17 LAB — SODIUM: Sodium: 148 mmol/L — ABNORMAL HIGH (ref 135–145)

## 2017-11-17 LAB — HIV ANTIBODY (ROUTINE TESTING W REFLEX): HIV Screen 4th Generation wRfx: NONREACTIVE

## 2017-11-17 MED ORDER — PHENYLEPHRINE HCL-NACL 10-0.9 MG/250ML-% IV SOLN
0.0000 ug/min | INTRAVENOUS | Status: DC
  Administered 2017-11-17: 225 ug/min via INTRAVENOUS

## 2017-11-17 MED ORDER — PHENYLEPHRINE HCL-NACL 10-0.9 MG/250ML-% IV SOLN
0.0000 ug/min | INTRAVENOUS | Status: DC
  Administered 2017-11-17: 225 ug/min via INTRAVENOUS
  Administered 2017-11-17: 50 ug/min via INTRAVENOUS
  Filled 2017-11-17: qty 250
  Filled 2017-11-17: qty 500
  Filled 2017-11-17 (×3): qty 250

## 2017-11-17 MED ORDER — SODIUM CHLORIDE 0.9 % IV SOLN
INTRAVENOUS | Status: DC
  Administered 2017-11-18 (×2): via INTRAVENOUS

## 2017-11-17 MED ORDER — PHENYLEPHRINE HCL-NACL 20-0.9 MG/250ML-% IV SOLN
0.0000 ug/min | INTRAVENOUS | Status: DC
  Administered 2017-11-17 – 2017-11-18 (×8): 225 ug/min via INTRAVENOUS
  Administered 2017-11-18: 400 ug/min via INTRAVENOUS
  Administered 2017-11-18: 225 ug/min via INTRAVENOUS
  Administered 2017-11-18: 300 ug/min via INTRAVENOUS
  Administered 2017-11-18: 400 ug/min via INTRAVENOUS
  Administered 2017-11-18: 245 ug/min via INTRAVENOUS
  Administered 2017-11-18 (×2): 300 ug/min via INTRAVENOUS
  Administered 2017-11-18: 350 ug/min via INTRAVENOUS
  Administered 2017-11-18: 400 ug/min via INTRAVENOUS
  Administered 2017-11-18: 300 ug/min via INTRAVENOUS
  Administered 2017-11-18: 235 ug/min via INTRAVENOUS
  Administered 2017-11-18: 225 ug/min via INTRAVENOUS
  Filled 2017-11-17 (×23): qty 250

## 2017-11-17 NOTE — Progress Notes (Signed)
STROKE TEAM PROGRESS NOTE   SUBJECTIVE (INTERVAL HISTORY) His wife is at the bedside. Pt BP drops this am. HR at 70s. On exam, pt has no brainstem function, not breathing over the vent. I had long discussion with wife at bedside, updated pt current condition, and clinical breath death. Family currently refused apnea test, but wish to keep ventilation until later today all family can come visit him before terminal extubation.      OBJECTIVE Temp:  [95 F (35 C)-101.7 F (38.7 C)] 101.7 F (38.7 C) (11/05 0700) Pulse Rate:  [55-94] 66 (11/05 0814) Cardiac Rhythm: Normal sinus rhythm (11/04 2000) Resp:  [15-30] 15 (11/05 0814) BP: (96-146)/(42-84) 96/42 (11/05 0814) SpO2:  [92 %-100 %] 92 % (11/05 0814) FiO2 (%):  [40 %-60 %] 60 % (11/05 0814) Weight:  [132 kg] 132 kg (11/04 1200)  No results for input(s): GLUCAP in the last 168 hours. Recent Labs  Lab 12/02/2017 0718 11/26/2017 1231 12/10/2017 2028 11/17/17 0349  NA 140 141 141 148*  K 3.6  --   --   --   CL 110  --   --   --   CO2 23  --   --   --   GLUCOSE 167*  --   --   --   BUN 14  --   --   --   CREATININE 1.65*  --   --   --   CALCIUM 8.8*  --   --   --    Recent Labs  Lab 12/07/2017 0718  AST 28  ALT 25  ALKPHOS 89  BILITOT 0.8  PROT 8.0  ALBUMIN 3.4*   Recent Labs  Lab 12/10/2017 0718  WBC 9.5  NEUTROABS 6.4  HGB 14.9  HCT 47.4  MCV 93.9  PLT 273   Recent Labs  Lab 11/19/2017 0718  TROPONINI <0.03   No results for input(s): LABPROT, INR in the last 72 hours. Recent Labs    11/17/2017 0745  COLORURINE YELLOW  LABSPEC 1.012  PHURINE 5.0  GLUCOSEU 50*  HGBUR MODERATE*  BILIRUBINUR NEGATIVE  KETONESUR NEGATIVE  PROTEINUR 100*  NITRITE NEGATIVE  LEUKOCYTESUR NEGATIVE       Component Value Date/Time   CHOL 137 06/23/2017 1542   TRIG 83.0 06/23/2017 1542   HDL 35.00 (L) 06/23/2017 1542   CHOLHDL 4 06/23/2017 1542   VLDL 16.6 06/23/2017 1542   LDLCALC 86 06/23/2017 1542   Lab Results  Component  Value Date   HGBA1C 6.6 (H) 06/23/2017      Component Value Date/Time   LABOPIA NONE DETECTED 11/17/2017 0745   COCAINSCRNUR NONE DETECTED 12/12/2017 0745   LABBENZ NONE DETECTED 12/12/2017 0745   AMPHETMU NONE DETECTED 11/24/2017 0745   THCU NONE DETECTED 12/01/2017 0745   LABBARB NONE DETECTED 12/02/2017 0745    Recent Labs  Lab 11/19/2017 0718  ETH <10    I have personally reviewed the radiological images below and agree with the radiology interpretations.  Ct Head Wo Contrast  Result Date: 12/09/2017 CLINICAL DATA:  Altered mental status EXAM: CT HEAD WITHOUT CONTRAST TECHNIQUE: Contiguous axial images were obtained from the base of the skull through the vertex without intravenous contrast. COMPARISON:  None. FINDINGS: Brain: There is a 7.6 x 3.9 cm parenchymal hemorrhage with its epicenter in the left basal ganglia. This results in significant mass effect upon the adjacent brain parenchyma structures. There is 13 mm midline shift from left to right. There is some surrounding vasogenic edema.  There is extension of hemorrhage into the ventricular system, including the bilateral lateral ventricles, third ventricle, and fourth ventricle. There is dilatation of the ventricular system and effacement of the sulci compatible with developing pressure in the ventricular system and possible cerebral edema. The suprasellar system is somewhat effaced. The top of the brainstem is somewhat distorted due to mass effect. See image 12 of series 3. The left uncus is pressed upon the brainstem worrisome for impending uncal herniation. Vascular: No hyperdense vessel or unexpected calcification. Skull: The cranium is intact. Sinuses/Orbits: There is mucosal thickening throughout the ethmoid air cells. Minimal polypoid mucosal thickening in the maxillary sinuses. Frontal sinuses and mastoid air cells are clear. Other: Endotracheal and OG tubes are in place. IMPRESSION: Large parenchymal hemorrhage in the left  basal ganglia most consistent with a hypertensive hemorrhage. There is extension of the hemorrhage into the ventricular system with dilatation of the ventricular system consistent with hydrocephalus. There is 13 mm of midline shift from left to right. Critical Value/emergent results were called by telephone at the time of interpretation on 11/15/2017 at 7:51 am to Dr. Dione Booze , who verbally acknowledged these results. Electronically Signed   By: Jolaine Click M.D.   On: 11/30/2017 07:51   Dg Chest Port 1 View  Result Date: 12/04/2017 CLINICAL DATA:  Endotracheal tube placement. EXAM: PORTABLE CHEST 1 VIEW COMPARISON:  None. FINDINGS: Endotracheal tube tip is above the sternal notch 9.5 cm above the carina. Enteric tube courses into the region of the stomach and off the inferior portion of the film as tip is not visualized. Lungs are adequately inflated with linear density over the right midlung likely atelectasis. There is minimal prominence of the perihilar markings suggesting mild vascular congestion. Mild cardiomegaly. Degenerative change of the spine. IMPRESSION: Mild cardiomegaly with suggestion mild vascular congestion. Linear density right midlung likely atelectasis. Infection in right perihilar region is possible. Endotracheal tube has tip 9.5 cm above the carina. Enteric tube courses into the region of the stomach and off the film as tip is not visualized. Electronically Signed   By: Elberta Fortis M.D.   On: 11/15/2017 07:19   Dg Abd Portable 1v  Result Date: 11/22/2017 CLINICAL DATA:  57 year old male status post orogastric tube placement. EXAM: PORTABLE ABDOMEN - 1 VIEW COMPARISON:  No priors. FINDINGS: Enteric tube with tip in the mid body of the stomach. Visualized bowel gas pattern is nonobstructive. Visualized portions of the lower thorax demonstrate a small right pleural effusion and low lung volumes. IMPRESSION: 1. Tip of enteric tube is in the mid body of the stomach. 2. Low lung volumes  with small right pleural effusion. Electronically Signed   By: Trudie Reed M.D.   On: 12/03/2017 14:52    PHYSICAL EXAM  Temp:  [95 F (35 C)-101.7 F (38.7 C)] 101.7 F (38.7 C) (11/05 0700) Pulse Rate:  [55-94] 66 (11/05 0814) Resp:  [15-30] 15 (11/05 0814) BP: (96-146)/(42-84) 96/42 (11/05 0814) SpO2:  [92 %-100 %] 92 % (11/05 0814) FiO2 (%):  [40 %-60 %] 60 % (11/05 0814) Weight:  [132 kg] 132 kg (11/04 1200)  General - morbid obesity, well developed, intubated.  Ophthalmologic - fundi not visualized due to noncooperation.  Cardiovascular - Regular rate and rhythm.  Neuro - intubated, not on sedation, not response to pain or voice. Not breathing over the vent. Pupils bilaterally 6mm, fixed, no papillary reflex, no corneal or gag or cough. Cold caloric testing negative, no doll's eyes. On pain  stimulation, no response in all 4 limbs. DTR diminished, however, weak babinski bilaterally, which only indicate weak spinal cord function but not higher cortical function.   ASSESSMENT/PLAN Patrick Watts is a 57 y.o. male with history of HTN, HLD, DM, morbid obesity admitted for unresponsiveness. No tPA given due to large ICH and IVH.    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   Disposition: I had long discussion with wife at bedside, updated pt current condition, and clinical breath death. Family currently refused apnea test, but wish to keep ventilation until later today all family can come visit him before terminal extubation.   Obstructive hydrocephalus  CT showed obstructive hydrocephalus  NSG consulted not candidate for surgery  Was on 3% saline  Currently  clinically brain death  Discussed with wife and she is agree with terminal extubation after family visit.  Uncal herniation  Due to large ICH, IVH and obstructive hydrocephalus  No brainstem reflexes  Clinically brain death  Will stop 3% saline  Na 148  Discussed with wife and she is agree with terminal extubation after family visit.  Hypotension hypertension  Was on cardene for HTN overnight  BP 80s this am  cardene discontinued  Started on neo  Try to hold on until family visit before extubation  Diabetes  On home med of metformin, actos and latus  Hyperlipidemia  Home meds:  pravastatin  Other Stroke Risk Factors  Morbid Obesity, Body mass index is 45.58 kg/m.   Other Active Problems  HIV negative  CKD stage III, Cre 1.65  Hospital day # 1  This patient is critically ill due to large ICH IVH and hydrocephalus and at significant risk of neurological worsening, death form brain herniation and brain death. This patient's care requires constant monitoring of vital signs, hemodynamics, respiratory and cardiac monitoring, review of multiple databases, neurological assessment, discussion with family, other specialists and medical decision making of high complexity. I spent 40 minutes of neurocritical care time in the care of this patient.  I had long discussion with wife at bedside, updated pt current condition, and clinical breath death. Family currently refused apnea test, but wish to keep ventilation until later today all family can come visit him before terminal extubation.   Marvel Plan, MD PhD Stroke Neurology 11/17/2017 10:16 AM    To contact Stroke Continuity provider, please refer to [email or web address]. After hours, contact General Neurology

## 2017-11-17 NOTE — Progress Notes (Signed)
PT Cancellation Note  Patient Details Name: Patrick Watts MRN: [id number] DOB: [birth year]   Cancelled Treatment:    Reason Eval/Treat Not Completed: Medical issues which prohibited therapy(pt with bedrest order on the vent)   Cutler Sunday B [name] 11/17/2017, 7:04 AM Delaney Meigs, PT Acute Rehabilitation Services Pager: [phone] Office: [phone]

## 2017-11-17 NOTE — Progress Notes (Signed)
Nutrition Brief Note  Chart reviewed. Per MD pt with large ICH, no brainstem reflexes plan for terminal extubation.  No nutrition interventions warranted at this time.  Please consult as needed.   Kendell Bane RD, LDN, CNSC [phone] Pager [phone] After Hours Pager

## 2017-11-17 NOTE — Progress Notes (Signed)
0310: Pt IV's infusing in both hands making it difficult for Lab to draw labs. MD consulted and RN given verbal to draw from feet.  0320: Cardene maxed out at 15. Current BP (145/53 MAP:77) outside of parameters 120-140. MD made aware. No change to care given to RN.

## 2017-11-17 NOTE — Progress Notes (Signed)
Chaplain witnessed an overflow crowd in 4N, and inquired of the Geographical information systems officer who they were gathered for. Patient on life support at this time.  Many persons were present in his room as the chaplain entered. Daughter's teacher was leading the group in prayer.  Chaplain offered support and presence when they completed their prayer and daughter said they were all set.  Will be available if needed. Lynnell Chad  [phone]

## 2017-11-17 NOTE — Progress Notes (Signed)
OT Cancellation Note  Patient Details Name: Patrick Watts MRN: [id number] DOB: 25-Aug-1960   Cancelled Treatment:    Reason Eval/Treat Not Completed: Active bedrest order  [facility] Athziri Freundlich, OT/L   Acute OT Clinical Specialist Acute Rehabilitation Services Pager [phone] Office [phone]  11/17/2017, 10:14 AM

## 2017-11-17 NOTE — Consult Note (Signed)
PCCM Procedure Note   I was asked to come and evaluate Patrick Watts with apnea testing for possible brain death.  He is 83 and has a large intracerebral hemorrhage without any other brainstem reflexes present.  He was placed on pressure support ventilation and he does have a spontaneous drive to breathe.  Thus he does not meet standard criteria for brain death.  Patrick Pupa, MD, PhD 12/01/2017, 2:52 PM [facility] [phone] or if no answer [phone]

## 2017-11-17 NOTE — Evaluation (Signed)
SLP Cancellation Note  Patient Details Name: Patrick Watts MRN: [id number] DOB: 10/27/1960   Cancelled treatment:       Reason Eval/Treat Not Completed: Medical issues which prohibited therapy(pt remains on vent)   Chales Abrahams 11/17/2017, 7:31 AM   Donavan Burnet, MS [facility] SLP Acute Rehab Services Pager [phone] Office [phone]

## 2017-11-18 ENCOUNTER — Inpatient Hospital Stay (HOSPITAL_COMMUNITY): Payer: Managed Care, Other (non HMO)

## 2017-11-18 ENCOUNTER — Other Ambulatory Visit (HOSPITAL_COMMUNITY): Payer: Managed Care, Other (non HMO)

## 2017-11-18 DIAGNOSIS — J9601 Acute respiratory failure with hypoxia: Secondary | ICD-10-CM

## 2017-11-18 DIAGNOSIS — E232 Diabetes insipidus: Secondary | ICD-10-CM

## 2017-11-18 DIAGNOSIS — I610 Nontraumatic intracerebral hemorrhage in hemisphere, subcortical: Secondary | ICD-10-CM

## 2017-11-18 DIAGNOSIS — N179 Acute kidney failure, unspecified: Secondary | ICD-10-CM

## 2017-11-18 DIAGNOSIS — I9589 Other hypotension: Secondary | ICD-10-CM

## 2017-11-18 DIAGNOSIS — J40 Bronchitis, not specified as acute or chronic: Secondary | ICD-10-CM

## 2017-11-18 LAB — POCT I-STAT 3, ART BLOOD GAS (G3+)
ACID-BASE DEFICIT: 12 mmol/L — AB (ref 0.0–2.0)
ACID-BASE DEFICIT: 16 mmol/L — AB (ref 0.0–2.0)
ACID-BASE DEFICIT: 10 mmol/L — AB (ref 0.0–2.0)
Bicarbonate: 17.3 mmol/L — ABNORMAL LOW (ref 20.0–28.0)
Bicarbonate: 11.6 mmol/L — ABNORMAL LOW (ref 20.0–28.0)
Bicarbonate: 17.2 mmol/L — ABNORMAL LOW (ref 20.0–28.0)
O2 SAT: 91 %
O2 SAT: 93 %
O2 Saturation: 74 %
PCO2 ART: 53.4 mmHg — AB (ref 32.0–48.0)
PH ART: 7.11 — AB (ref 7.350–7.450)
PH ART: 7.152 — AB (ref 7.350–7.450)
PO2 ART: 73 mmHg — AB (ref 83.0–108.0)
Patient temperature: 35.6
TCO2: 19 mmol/L — ABNORMAL LOW (ref 22–32)
TCO2: 13 mmol/L — ABNORMAL LOW (ref 22–32)
TCO2: 19 mmol/L — ABNORMAL LOW (ref 22–32)
pCO2 arterial: 32.5 mmHg (ref 32.0–48.0)
pCO2 arterial: 41.6 mmHg (ref 32.0–48.0)
pH, Arterial: 7.217 — ABNORMAL LOW (ref 7.350–7.450)
pO2, Arterial: 49 mmHg — ABNORMAL LOW (ref 83.0–108.0)
pO2, Arterial: 73 mmHg — ABNORMAL LOW (ref 83.0–108.0)

## 2017-11-18 LAB — COMPREHENSIVE METABOLIC PANEL
ALT: 21 U/L (ref 0–44)
AST: 34 U/L (ref 15–41)
Albumin: 2.3 g/dL — ABNORMAL LOW (ref 3.5–5.0)
Alkaline Phosphatase: 77 U/L (ref 38–126)
BILIRUBIN TOTAL: 1 mg/dL (ref 0.3–1.2)
BUN: 19 mg/dL (ref 6–20)
CO2: 20 mmol/L — ABNORMAL LOW (ref 22–32)
Calcium: 8.5 mg/dL — ABNORMAL LOW (ref 8.9–10.3)
Chloride: >130 mmol/L (ref 98–111)
Creatinine, Ser: 2.22 mg/dL — ABNORMAL HIGH (ref 0.61–1.24)
GFR, EST AFRICAN AMERICAN: 36 mL/min — AB (ref >60)
GFR, EST NON AFRICAN AMERICAN: 31 mL/min — AB (ref >60)
Glucose, Bld: 208 mg/dL — ABNORMAL HIGH (ref 70–99)
POTASSIUM: 3.2 mmol/L — AB (ref 3.5–5.1)
SODIUM: 167 mmol/L — AB (ref 135–145)
TOTAL PROTEIN: 6 g/dL — AB (ref 6.5–8.1)

## 2017-11-18 LAB — CK TOTAL AND CKMB (NOT AT ARMC)
CK TOTAL: 987 U/L — AB (ref 49–397)
CK, MB: 7.6 ng/mL — ABNORMAL HIGH (ref 0.5–5.0)
RELATIVE INDEX: 0.8 (ref 0.0–2.5)

## 2017-11-18 LAB — BASIC METABOLIC PANEL
BUN: 23 mg/dL — ABNORMAL HIGH (ref 6–20)
BUN: 24 mg/dL — ABNORMAL HIGH (ref 6–20)
CALCIUM: 8.6 mg/dL — AB (ref 8.9–10.3)
CALCIUM: 8.6 mg/dL — AB (ref 8.9–10.3)
CO2: 23 mmol/L (ref 22–32)
CO2: 23 mmol/L (ref 22–32)
CREATININE: 2.03 mg/dL — AB (ref 0.61–1.24)
Chloride: >130 mmol/L (ref 98–111)
Chloride: >130 mmol/L (ref 98–111)
Creatinine, Ser: 2.1 mg/dL — ABNORMAL HIGH (ref 0.61–1.24)
GFR calc Af Amer: 40 mL/min — ABNORMAL LOW (ref >60)
GFR calc Af Amer: 39 mL/min — ABNORMAL LOW (ref >60)
GFR calc non Af Amer: 35 mL/min — ABNORMAL LOW (ref >60)
GFR calc non Af Amer: 34 mL/min — ABNORMAL LOW (ref >60)
GLUCOSE: 212 mg/dL — AB (ref 70–99)
GLUCOSE: 194 mg/dL — AB (ref 70–99)
Potassium: 4.2 mmol/L (ref 3.5–5.1)
Potassium: 4 mmol/L (ref 3.5–5.1)
Sodium: 162 mmol/L (ref 135–145)
Sodium: 163 mmol/L (ref 135–145)

## 2017-11-18 LAB — EXPECTORATED SPUTUM ASSESSMENT W GRAM STAIN, RFLX TO RESP C

## 2017-11-18 LAB — TROPONIN I: Troponin I: 0.06 ng/mL (ref <0.03)

## 2017-11-18 LAB — CBC
HEMATOCRIT: 45.7 % (ref 39.0–52.0)
HEMOGLOBIN: 13.1 g/dL (ref 13.0–17.0)
MCH: 29.3 pg (ref 26.0–34.0)
MCHC: 28.7 g/dL — AB (ref 30.0–36.0)
MCV: 102.2 fL — ABNORMAL HIGH (ref 80.0–100.0)
NRBC: 0 % (ref 0.0–0.2)
Platelets: 214 10*3/uL (ref 150–400)
RBC: 4.47 MIL/uL (ref 4.22–5.81)
RDW: 14.8 % (ref 11.5–15.5)
WBC: 17.8 10*3/uL — AB (ref 4.0–10.5)

## 2017-11-18 LAB — BILIRUBIN, DIRECT: BILIRUBIN DIRECT: 0.4 mg/dL — AB (ref 0.0–0.2)

## 2017-11-18 LAB — BLOOD GAS, ARTERIAL
Acid-base deficit: 8.5 mmol/L — ABNORMAL HIGH (ref 0.0–2.0)
BICARBONATE: 19 mmol/L — AB (ref 20.0–28.0)
DRAWN BY: 398991
FIO2: 100
LHR: 15 {breaths}/min
O2 Saturation: 94.9 %
PEEP: 8 cmH2O
Patient temperature: 98.6
pCO2 arterial: 57.7 mmHg — ABNORMAL HIGH (ref 32.0–48.0)
pH, Arterial: 7.145 — CL (ref 7.350–7.450)
pO2, Arterial: 89.9 mmHg (ref 83.0–108.0)

## 2017-11-18 LAB — ABO/RH
ABO/RH(D): A POS
PT AG TYPE: POSITIVE

## 2017-11-18 LAB — GLUCOSE, CAPILLARY
GLUCOSE-CAPILLARY: 193 mg/dL — AB (ref 70–99)
GLUCOSE-CAPILLARY: 304 mg/dL — AB (ref 70–99)
Glucose-Capillary: 313 mg/dL — ABNORMAL HIGH (ref 70–99)
Glucose-Capillary: 324 mg/dL — ABNORMAL HIGH (ref 70–99)
Glucose-Capillary: 253 mg/dL — ABNORMAL HIGH (ref 70–99)

## 2017-11-18 LAB — DIFFERENTIAL
ABS IMMATURE GRANULOCYTES: 0.23 10*3/uL — AB (ref 0.00–0.07)
BASOS ABS: 0.1 10*3/uL (ref 0.0–0.1)
BASOS PCT: 0 %
Eosinophils Absolute: 0.5 10*3/uL (ref 0.0–0.5)
Eosinophils Relative: 3 %
IMMATURE GRANULOCYTES: 1 %
LYMPHS PCT: 14 %
Lymphs Abs: 2.5 10*3/uL (ref 0.7–4.0)
Monocytes Absolute: 1.3 10*3/uL — ABNORMAL HIGH (ref 0.1–1.0)
Monocytes Relative: 7 %
NEUTROS ABS: 13.2 10*3/uL — AB (ref 1.7–7.7)
NEUTROS PCT: 75 %

## 2017-11-18 LAB — FIBRINOGEN: Fibrinogen: 669 mg/dL — ABNORMAL HIGH (ref 210–475)

## 2017-11-18 LAB — EXPECTORATED SPUTUM ASSESSMENT W REFEX TO RESP CULTURE

## 2017-11-18 LAB — MAGNESIUM: Magnesium: 1.7 mg/dL (ref 1.7–2.4)

## 2017-11-18 LAB — PHOSPHORUS: Phosphorus: 1.9 mg/dL — ABNORMAL LOW (ref 2.5–4.6)

## 2017-11-18 MED ORDER — PHENYLEPHRINE HCL 10 MG/ML IJ SOLN
INTRAVENOUS | Status: DC
  Administered 2017-11-18: 18:00:00 via INTRAVENOUS
  Filled 2017-11-18 (×18): qty 250

## 2017-11-18 MED ORDER — PHENYLEPHRINE HCL 10 MG/ML IJ SOLN
0.0000 ug/min | INTRAVENOUS | Status: DC
  Administered 2017-11-18: 260 ug/min via INTRAVENOUS
  Administered 2017-11-18: 200 ug/min via INTRAVENOUS
  Administered 2017-11-19: 160 ug/min via INTRAVENOUS
  Filled 2017-11-18 (×5): qty 4

## 2017-11-18 MED ORDER — FAMOTIDINE IN NACL 20-0.9 MG/50ML-% IV SOLN: 20.0000 mg | Freq: Two times a day (BID) | INTRAVENOUS | Status: DC

## 2017-11-18 MED ORDER — VANCOMYCIN HCL IN DEXTROSE 1.5-5 GM/500ML-% IV SOLN
1500.0000 mg | INTRAVENOUS | Status: DC
  Filled 2017-11-18: qty 1

## 2017-11-18 MED ORDER — MAGNESIUM SULFATE 2 GM/50ML IV SOLN: 2.0000 g | Freq: Once | INTRAVENOUS | Status: AC

## 2017-11-18 MED ORDER — MAGNESIUM SULFATE 50 % IJ SOLN
Freq: Once | INTRAVENOUS | Status: AC
  Administered 2017-11-18: 23:00:00 via INTRAVENOUS
  Filled 2017-11-18: qty 50

## 2017-11-18 MED ORDER — TECHNETIUM TC 99M EXAMETAZIME IV KIT
20.5000 | PACK | Freq: Once | INTRAVENOUS | Status: AC | PRN
  Administered 2017-11-18: 20.5 via INTRAVENOUS

## 2017-11-18 MED ORDER — SODIUM BICARBONATE 8.4 % IV SOLN
INTRAVENOUS | Status: AC
  Filled 2017-11-18: qty 50

## 2017-11-18 MED ORDER — VANCOMYCIN HCL 10 G IV SOLR
1500.0000 mg | INTRAVENOUS | Status: DC
  Filled 2017-11-18: qty 1500

## 2017-11-18 MED ORDER — PHENYLEPHRINE HCL-NACL 20-0.9 MG/250ML-% IV SOLN
0.0000 ug/min | INTRAVENOUS | Status: DC
  Filled 2017-11-18: qty 250

## 2017-11-18 MED ORDER — INSULIN REGULAR(HUMAN) IN NACL 100-0.9 UT/100ML-% IV SOLN
INTRAVENOUS | Status: DC
  Administered 2017-11-18: 2.4 [IU]/h via INTRAVENOUS
  Administered 2017-11-19: 16.1 [IU]/h via INTRAVENOUS
  Administered 2017-11-19: 28.7 [IU]/h via INTRAVENOUS
  Filled 2017-11-18 (×3): qty 100

## 2017-11-18 MED ORDER — POTASSIUM PHOSPHATES 15 MMOLE/5ML IV SOLN
20.0000 meq | Freq: Once | INTRAVENOUS | Status: AC
  Administered 2017-11-18: 20 meq via INTRAVENOUS
  Filled 2017-11-18: qty 4.55

## 2017-11-18 MED ORDER — NOREPINEPHRINE 4 MG/250ML-% IV SOLN
INTRAVENOUS | Status: AC
  Filled 2017-11-18: qty 250

## 2017-11-18 MED ORDER — LEVOTHYROXINE SODIUM 100 MCG IV SOLR
10.0000 ug/h | INTRAVENOUS | Status: DC
  Administered 2017-11-18: 10 ug/h via INTRAVENOUS
  Filled 2017-11-18 (×2): qty 10

## 2017-11-18 MED ORDER — SODIUM BICARBONATE 8.4 % IV SOLN
100.0000 meq | Freq: Once | INTRAVENOUS | Status: AC
  Administered 2017-11-18: 100 meq via INTRAVENOUS

## 2017-11-18 MED ORDER — SODIUM CHLORIDE 0.9 % IV SOLN: 2000.0000 mg | Freq: Once | INTRAVENOUS | Status: DC

## 2017-11-18 MED ORDER — SODIUM BICARBONATE 8.4 % IV SOLN
INTRAVENOUS | Status: AC
  Administered 2017-11-18: 18:00:00
  Filled 2017-11-18: qty 50

## 2017-11-18 MED ORDER — SODIUM CHLORIDE 0.45 % IV SOLN
INTRAVENOUS | Status: DC
  Administered 2017-11-18 (×2): via INTRAVENOUS

## 2017-11-18 MED ORDER — NOREPINEPHRINE 4 MG/250ML-% IV SOLN
0.0000 ug/min | INTRAVENOUS | Status: DC
  Administered 2017-11-18: 15 ug/min via INTRAVENOUS
  Administered 2017-11-18: 14 ug/min via INTRAVENOUS
  Administered 2017-11-19: 8 ug/min via INTRAVENOUS
  Filled 2017-11-18 (×3): qty 250

## 2017-11-18 MED ORDER — FREE WATER
150.0000 mL | Status: DC
  Administered 2017-11-18 – 2017-11-19 (×9): 150 mL

## 2017-11-18 MED ORDER — SODIUM CHLORIDE 0.9 % IV SOLN
10.0000 ug/h | INTRAVENOUS | Status: DC
  Filled 2017-11-18: qty 10

## 2017-11-18 MED ORDER — INSULIN ASPART 100 UNIT/ML ~~LOC~~ SOLN
20.0000 [IU] | Freq: Once | SUBCUTANEOUS | Status: AC
  Administered 2017-11-18: 20 [IU] via SUBCUTANEOUS

## 2017-11-18 MED ORDER — VANCOMYCIN HCL IN DEXTROSE 1.5-5 GM/500ML-% IV SOLN
1500.0000 mg | INTRAVENOUS | Status: AC
  Administered 2017-11-18: 1500 mg via INTRAVENOUS
  Filled 2017-11-18: qty 0
  Filled 2017-11-18 (×2): qty 1

## 2017-11-18 MED ORDER — NOREPINEPHRINE 4 MG/250ML-% IV SOLN
0.0000 ug/min | INTRAVENOUS | Status: DC
  Administered 2017-11-18: 2 ug/min via INTRAVENOUS
  Filled 2017-11-18: qty 250

## 2017-11-18 MED ORDER — SODIUM CHLORIDE 0.9 % IV SOLN: INTRAVENOUS | Status: DC | PRN

## 2017-11-18 MED ORDER — IPRATROPIUM-ALBUTEROL 0.5-2.5 (3) MG/3ML IN SOLN
3.0000 mL | RESPIRATORY_TRACT | Status: DC
  Administered 2017-11-18 – 2017-11-19 (×5): 3 mL via RESPIRATORY_TRACT
  Filled 2017-11-18 (×5): qty 3

## 2017-11-18 MED ORDER — SODIUM BICARBONATE 8.4 % IV SOLN
100.0000 meq | Freq: Once | INTRAVENOUS | Status: AC
  Administered 2017-11-18: 100 meq via INTRAVENOUS
  Filled 2017-11-18: qty 100

## 2017-11-18 MED ORDER — PHENYLEPHRINE HCL 10 MG/ML IJ SOLN
INTRAVENOUS | Status: AC
  Administered 2017-11-18: 16:00:00 via INTRAVENOUS
  Filled 2017-11-18 (×10): qty 250

## 2017-11-18 MED ORDER — HEPARIN SODIUM (PORCINE) 1000 UNIT/ML IJ SOLN
39600.0000 [IU] | Freq: Once | INTRAMUSCULAR | Status: DC
  Filled 2017-11-18: qty 39.6

## 2017-11-18 MED ORDER — DEXTROSE 50 % IV SOLN
50.0000 mL | Freq: Once | INTRAVENOUS | Status: AC
  Administered 2017-11-18: 50 mL via INTRAVENOUS
  Filled 2017-11-18: qty 50

## 2017-11-18 MED ORDER — LEVOTHYROXINE SODIUM 100 MCG IV SOLR
20.0000 ug | Freq: Once | INTRAVENOUS | Status: AC
  Administered 2017-11-18: 20 ug via INTRAVENOUS
  Filled 2017-11-18: qty 5

## 2017-11-18 MED ORDER — IPRATROPIUM-ALBUTEROL 0.5-2.5 (3) MG/3ML IN SOLN: 3.0000 mL | RESPIRATORY_TRACT | Status: DC | PRN

## 2017-11-18 MED ORDER — HEPARIN SODIUM (PORCINE) 1000 UNIT/ML IJ SOLN
39600.0000 [IU] | INTRAMUSCULAR | Status: AC
  Filled 2017-11-18: qty 39.6

## 2017-11-18 MED ORDER — SODIUM CHLORIDE 0.9 % IV SOLN
2000.0000 mg | Freq: Once | INTRAVENOUS | Status: AC
  Administered 2017-11-18: 2000 mg via INTRAVENOUS
  Filled 2017-11-18: qty 16

## 2017-11-18 MED ORDER — SODIUM CHLORIDE 0.9 % IV SOLN: 1000.0000 mg | Freq: Once | INTRAVENOUS | Status: DC

## 2017-11-18 MED ORDER — PANTOPRAZOLE SODIUM 40 MG PO PACK: 40.0000 mg | PACK | Freq: Every day | ORAL | Status: DC

## 2017-11-19 DIAGNOSIS — I619 Nontraumatic intracerebral hemorrhage, unspecified: Secondary | ICD-10-CM

## 2017-11-19 LAB — CBC WITH DIFFERENTIAL/PLATELET
ABS IMMATURE GRANULOCYTES: 0.15 10*3/uL — AB (ref 0.00–0.07)
Basophils Absolute: 0 10*3/uL (ref 0.0–0.1)
Basophils Relative: 0 %
EOS ABS: 0 10*3/uL (ref 0.0–0.5)
Eosinophils Relative: 0 %
HCT: 44.7 % (ref 39.0–52.0)
Hemoglobin: 13 g/dL (ref 13.0–17.0)
IMMATURE GRANULOCYTES: 1 %
Lymphocytes Relative: 3 %
Lymphs Abs: 0.5 10*3/uL — ABNORMAL LOW (ref 0.7–4.0)
MCH: 29.5 pg (ref 26.0–34.0)
MCHC: 29.1 g/dL — ABNORMAL LOW (ref 30.0–36.0)
MCV: 101.4 fL — AB (ref 80.0–100.0)
MONO ABS: 0.7 10*3/uL (ref 0.1–1.0)
MONOS PCT: 4 %
NEUTROS ABS: 18.1 10*3/uL — AB (ref 1.7–7.7)
NEUTROS PCT: 92 %
Platelets: 188 10*3/uL (ref 150–400)
RBC: 4.41 MIL/uL (ref 4.22–5.81)
RDW: 14.7 % (ref 11.5–15.5)
WBC: 19.5 10*3/uL — AB (ref 4.0–10.5)
nRBC: 0 % (ref 0.0–0.2)

## 2017-11-19 LAB — GLUCOSE, CAPILLARY
GLUCOSE-CAPILLARY: 234 mg/dL — AB (ref 70–99)
GLUCOSE-CAPILLARY: 273 mg/dL — AB (ref 70–99)
GLUCOSE-CAPILLARY: 291 mg/dL — AB (ref 70–99)
GLUCOSE-CAPILLARY: 292 mg/dL — AB (ref 70–99)
GLUCOSE-CAPILLARY: 310 mg/dL — AB (ref 70–99)
GLUCOSE-CAPILLARY: 325 mg/dL — AB (ref 70–99)
Glucose-Capillary: 282 mg/dL — ABNORMAL HIGH (ref 70–99)
Glucose-Capillary: 290 mg/dL — ABNORMAL HIGH (ref 70–99)
Glucose-Capillary: 293 mg/dL — ABNORMAL HIGH (ref 70–99)
Glucose-Capillary: 229 mg/dL — ABNORMAL HIGH (ref 70–99)
Glucose-Capillary: 216 mg/dL — ABNORMAL HIGH (ref 70–99)
Glucose-Capillary: 281 mg/dL — ABNORMAL HIGH (ref 70–99)

## 2017-11-19 LAB — POCT I-STAT 3, ART BLOOD GAS (G3+)
ACID-BASE DEFICIT: 7 mmol/L — AB (ref 0.0–2.0)
Acid-base deficit: 8 mmol/L — ABNORMAL HIGH (ref 0.0–2.0)
BICARBONATE: 19.1 mmol/L — AB (ref 20.0–28.0)
Bicarbonate: 19.4 mmol/L — ABNORMAL LOW (ref 20.0–28.0)
O2 Saturation: 93 %
O2 Saturation: 95 %
PCO2 ART: 42.6 mmHg (ref 32.0–48.0)
PH ART: 7.221 — AB (ref 7.350–7.450)
PO2 ART: 78 mmHg — AB (ref 83.0–108.0)
PO2 ART: 86 mmHg (ref 83.0–108.0)
Patient temperature: 98.1
TCO2: 20 mmol/L — AB (ref 22–32)
TCO2: 21 mmol/L — AB (ref 22–32)
pCO2 arterial: 46.3 mmHg (ref 32.0–48.0)
pH, Arterial: 7.265 — ABNORMAL LOW (ref 7.350–7.450)

## 2017-11-19 LAB — URINALYSIS, ROUTINE W REFLEX MICROSCOPIC
BILIRUBIN URINE: NEGATIVE
Ketones, ur: NEGATIVE mg/dL
LEUKOCYTES UA: NEGATIVE
Nitrite: NEGATIVE
PH: 5 (ref 5.0–8.0)
Protein, ur: NEGATIVE mg/dL
SPECIFIC GRAVITY, URINE: 1.002 — AB (ref 1.005–1.030)

## 2017-11-19 LAB — COMPREHENSIVE METABOLIC PANEL
ALK PHOS: 78 U/L (ref 38–126)
ALT: 22 U/L (ref 0–44)
AST: 40 U/L (ref 15–41)
Albumin: 2.1 g/dL — ABNORMAL LOW (ref 3.5–5.0)
BILIRUBIN TOTAL: 1.1 mg/dL (ref 0.3–1.2)
BUN: 16 mg/dL (ref 6–20)
CALCIUM: 8.7 mg/dL — AB (ref 8.9–10.3)
CO2: 21 mmol/L — ABNORMAL LOW (ref 22–32)
Chloride: >130 mmol/L (ref 98–111)
Creatinine, Ser: 2.53 mg/dL — ABNORMAL HIGH (ref 0.61–1.24)
GFR calc non Af Amer: 27 mL/min — ABNORMAL LOW (ref >60)
GFR, EST AFRICAN AMERICAN: 31 mL/min — AB (ref >60)
GLUCOSE: 354 mg/dL — AB (ref 70–99)
Sodium: 168 mmol/L (ref 135–145)
Total Protein: 6.1 g/dL — ABNORMAL LOW (ref 6.5–8.1)

## 2017-11-19 LAB — URINE CULTURE: Culture: NO GROWTH

## 2017-11-19 LAB — PSA: PROSTATIC SPECIFIC ANTIGEN: 11.93 ng/mL — AB (ref 0.00–4.00)

## 2017-11-19 LAB — BASIC METABOLIC PANEL
BUN: 18 mg/dL (ref 6–20)
CALCIUM: 8.5 mg/dL — AB (ref 8.9–10.3)
CO2: 22 mmol/L (ref 22–32)
CREATININE: 2.55 mg/dL — AB (ref 0.61–1.24)
Chloride: >130 mmol/L (ref 98–111)
GFR, EST AFRICAN AMERICAN: 31 mL/min — AB (ref >60)
GFR, EST NON AFRICAN AMERICAN: 27 mL/min — AB (ref >60)
Glucose, Bld: 289 mg/dL — ABNORMAL HIGH (ref 70–99)
Potassium: <2 mmol/L — CL (ref 3.5–5.1)
Sodium: 167 mmol/L (ref 135–145)

## 2017-11-19 LAB — CK TOTAL AND CKMB (NOT AT ARMC)
CK TOTAL: 814 U/L — AB (ref 49–397)
CK, MB: 9.7 ng/mL — ABNORMAL HIGH (ref 0.5–5.0)
Relative Index: 1.2 (ref 0.0–2.5)

## 2017-11-19 LAB — PROTIME-INR
INR: 1.33
Prothrombin Time: 16.4 seconds — ABNORMAL HIGH (ref 11.4–15.2)

## 2017-11-19 LAB — PHOSPHORUS: Phosphorus: 1.1 mg/dL — ABNORMAL LOW (ref 2.5–4.6)

## 2017-11-19 LAB — MAGNESIUM: MAGNESIUM: 1.8 mg/dL (ref 1.7–2.4)

## 2017-11-19 LAB — TROPONIN I: TROPONIN I: 0.22 ng/mL — AB (ref <0.03)

## 2017-11-19 LAB — APTT: aPTT: 36 seconds (ref 24–36)

## 2017-11-19 LAB — LACTIC ACID, PLASMA: Lactic Acid, Venous: 4.3 mmol/L (ref 0.5–1.9)

## 2017-11-19 MED ORDER — INSULIN REGULAR(HUMAN) IN NACL 100-0.9 UT/100ML-% IV SOLN: 0.0000 [IU]/h | INTRAVENOUS | 5 refills | Status: AC

## 2017-11-19 MED ORDER — DEXTROSE 5 % IV BOLUS: 1000.0000 mL | Freq: Once | INTRAVENOUS | 0 refills | Status: AC

## 2017-11-19 MED ORDER — IPRATROPIUM-ALBUTEROL 0.5-2.5 (3) MG/3ML IN SOLN: 3.0000 mL | RESPIRATORY_TRACT | 1 refills | Status: AC

## 2017-11-19 MED ORDER — IPRATROPIUM-ALBUTEROL 0.5-2.5 (3) MG/3ML IN SOLN: 3.0000 mL | RESPIRATORY_TRACT | 1 refills | Status: AC | PRN

## 2017-11-19 MED ORDER — SODIUM CHLORIDE 0.9 % IV SOLN: 0.0000 mL | INTRAVENOUS | 0 refills | Status: AC | PRN

## 2017-11-19 MED ORDER — POTASSIUM CL IN DEXTROSE 5% 20 MEQ/L IV SOLN: 20.0000 meq | INTRAVENOUS | 5 refills | Status: AC

## 2017-11-19 MED ORDER — NOREPINEPHRINE 4 MG/250ML-% IV SOLN: 0.0000 ug/min | INTRAVENOUS | 0 refills | Status: AC

## 2017-11-19 MED ORDER — POTASSIUM CHLORIDE 10 MEQ/50ML IV SOLN
10.0000 meq | INTRAVENOUS | Status: AC
  Administered 2017-11-19 (×4): 10 meq via INTRAVENOUS
  Filled 2017-11-19 (×4): qty 50

## 2017-11-19 MED ORDER — LEVOTHYROXINE SODIUM 100 MCG IV SOLR: 10.0000 ug/h | INTRAVENOUS | 1 refills | Status: AC

## 2017-11-19 MED ORDER — HEPARIN SODIUM (PORCINE) 1000 UNIT/ML IJ SOLN: 39600.0000 [IU] | Freq: Once | INTRAMUSCULAR | 10 refills | Status: AC

## 2017-11-19 MED ORDER — FREE WATER: 200.0000 mL | 2 refills | Status: AC

## 2017-11-19 MED ORDER — POTASSIUM CL IN DEXTROSE 5% 20 MEQ/L IV SOLN
20.0000 meq | INTRAVENOUS | Status: DC
  Administered 2017-11-19: 20 meq via INTRAVENOUS
  Filled 2017-11-19: qty 1000

## 2017-11-19 MED ORDER — DEXTROSE 5 % IV BOLUS
1000.0000 mL | Freq: Once | INTRAVENOUS | Status: AC
  Administered 2017-11-19: 1000 mL via INTRAVENOUS

## 2017-11-19 MED ORDER — VANCOMYCIN HCL IN DEXTROSE 1.5-5 GM/500ML-% IV SOLN: 1500.0000 mg | INTRAVENOUS | 1 refills | Status: AC

## 2017-11-19 MED ORDER — ALBUMIN HUMAN 5 % IV SOLN
25.0000 g | Freq: Once | INTRAVENOUS | Status: AC
  Administered 2017-11-19: 25 g via INTRAVENOUS
  Filled 2017-11-19: qty 250

## 2017-11-19 MED ORDER — POTASSIUM CHLORIDE IN NACL 20-0.45 MEQ/L-% IV SOLN
INTRAVENOUS | Status: DC
  Administered 2017-11-19: 06:00:00 via INTRAVENOUS
  Filled 2017-11-19: qty 1000

## 2017-11-19 MED ORDER — LACTATED RINGERS IV BOLUS
500.0000 mL | Freq: Once | INTRAVENOUS | Status: AC
  Administered 2017-11-19: 500 mL via INTRAVENOUS

## 2017-11-19 MED ORDER — FREE WATER
200.0000 mL | Status: DC
  Administered 2017-11-19 (×3): 200 mL

## 2017-11-19 MED ORDER — POTASSIUM CHLORIDE 10 MEQ/50ML IV SOLN: 10.0000 meq | INTRAVENOUS | 1 refills | Status: AC

## 2017-11-19 NOTE — Discharge Summary (Addendum)
Stroke Discharge Summary  Patient ID: Patrick Watts   MRN: [id number]      DOB: 01-04-1961  Date of Admission: 2017/12/08 Date of Discharge: 11/19/2017  Attending Physician:  No att. providers found, Stroke MD Consultant(s):   Treatment Team:  Stroke, Md, MD pulmonary/intensive care and neuro surgery  Patient's PCP:  Joaquim Nam, MD  DISCHARGE DIAGNOSIS:    ICH - large left BG and thalamus   IVH - large    Obstructive hydrocephalus   Uncal herniation    Brain death  Active Problems:   AKI on CKD   Hypertensive emergency   DM   HLD   DI   Past Medical History:  Diagnosis Date  . Adverse effect of non-steroidal anti-inflammatory drug (NSAID)   . Diabetes mellitus   . Elevated serum creatinine    due to nsaids  . Gout   . Hyperlipidemia   . Hypertension   . Kidney stones    recurrent, ER 1996, 08/1999   History reviewed. No pertinent surgical history.  Allergies as of 11/19/2017      Reactions   Ibuprofen Other (See Comments)   Elevated Cr      Medication List    STOP taking these medications   allopurinol 300 MG tablet Commonly known as:  ZYLOPRIM   aspirin EC 81 MG tablet   colchicine 0.6 MG tablet   [employer] ALPHA LIPOIC ACID PO   gabapentin 300 MG capsule Commonly known as:  NEURONTIN   hydrocortisone cream 1 %   Insulin Glargine 100 UNIT/ML Solostar Pen Commonly known as:  LANTUS   Insulin Pen Needle 32G X 4 MM Misc   lisinopril 20 MG tablet Commonly known as:  PRINIVIL,ZESTRIL   metFORMIN 850 MG tablet Commonly known as:  GLUCOPHAGE   metoprolol succinate 25 MG 24 hr tablet Commonly known as:  TOPROL-XL   metoprolol tartrate 25 MG tablet Commonly known as:  LOPRESSOR   omeprazole 20 MG tablet Commonly known as:  PRILOSEC OTC   pioglitazone 45 MG tablet Commonly known as:  ACTOS   pravastatin 40 MG tablet Commonly known as:  PRAVACHOL   prednisoLONE acetate 0.12 % ophthalmic suspension Commonly known as:  PRED MILD    sildenafil 100 MG tablet Commonly known as:  VIAGRA   traMADol 50 MG tablet Commonly known as:  ULTRAM   Vitamin D (Ergocalciferol) 1.25 MG (50000 UT) Caps capsule Commonly known as:  DRISDOL     TAKE these medications   dextrose 5 % Inject 1,000 mLs into the vein once for 1 dose.   dextrose 5 % with KCl 20 mEq / L 20-5 MEQ/L-% Inject 1,000 mLs (20 mEq total) into the vein continuous.   free water Soln Place 200 mLs into feeding tube every hour.   heparin 1000 UNIT/ML injection Inject 39.6 mLs (39,600 Units total) into the vein once for 1 dose.   Insulin Regular(Human) in NaCl 100-0.9 UT/100ML-% Soln infusion Commonly known as:  MYXREDLIN Inject 0-20 Units/hr into the vein continuous.   ipratropium-albuterol 0.5-2.5 (3) MG/3ML Soln Commonly known as:  DUONEB Take 3 mLs by nebulization every 2 (two) hours as needed.   ipratropium-albuterol 0.5-2.5 (3) MG/3ML Soln Commonly known as:  DUONEB Take 3 mLs by nebulization every 4 (four) hours.   levothyroxine 200 mcg in dextrose 5 % 500 mL Inject 10-20 mcg/hr into the vein continuous.   norepinephrine 4-5 MG/250ML-% Soln Commonly known as:  LEVOPHED Inject 0-40 mcg/min into  the vein continuous.   potassium chloride 10 MEQ/50ML Inject 50 mLs (10 mEq total) into the vein every 1 hour x 4 doses.   sodium chloride 0.9 % infusion Inject 0-10 mLs into the artery as needed (For arterial line maintenance).   Vancomycin HCl in Dextrose 1.5-5 GM/500ML-% Soln Inject 1,500 mg into the vein to Surgery for 1 dose.       LABORATORY STUDIES CBC    Component Value Date/Time   WBC 19.5 (H) 11/19/2017 0217   RBC 4.41 11/19/2017 0217   HGB 13.0 11/19/2017 0217   HCT 44.7 11/19/2017 0217   PLT 188 11/19/2017 0217   MCV 101.4 (H) 11/19/2017 0217   MCH 29.5 11/19/2017 0217   MCHC 29.1 (L) 11/19/2017 0217   RDW 14.7 11/19/2017 0217   LYMPHSABS 0.5 (L) 11/19/2017 0217   MONOABS 0.7 11/19/2017 0217   EOSABS 0.0 11/19/2017 0217    BASOSABS 0.0 11/19/2017 0217   CMP    Component Value Date/Time   NA 167 (HH) 11/19/2017 0640   K <2.0 (LL) 11/19/2017 0640   CL >130 (HH) 11/19/2017 0640   CO2 22 11/19/2017 0640   GLUCOSE 289 (H) 11/19/2017 0640   BUN 18 11/19/2017 0640   CREATININE 2.55 (H) 11/19/2017 0640   CALCIUM 8.5 (L) 11/19/2017 0640   PROT 6.1 (L) 11/19/2017 0217   ALBUMIN 2.1 (L) 11/19/2017 0217   AST 40 11/19/2017 0217   ALT 22 11/19/2017 0217   ALKPHOS 78 11/19/2017 0217   BILITOT 1.1 11/19/2017 0217   GFRNONAA 27 (L) 11/19/2017 0640   GFRAA 31 (L) 11/19/2017 0640   COAGS Lab Results  Component Value Date   INR 1.33 11/19/2017   Lipid Panel    Component Value Date/Time   CHOL 137 06/23/2017 1542   TRIG 83.0 06/23/2017 1542   HDL 35.00 (L) 06/23/2017 1542   CHOLHDL 4 06/23/2017 1542   VLDL 16.6 06/23/2017 1542   LDLCALC 86 06/23/2017 1542   HgbA1C  Lab Results  Component Value Date   HGBA1C 6.6 (H) 06/23/2017   Urinalysis    Component Value Date/Time   COLORURINE STRAW (A) 11/19/2017 0223   APPEARANCEUR CLEAR 11/19/2017 0223   LABSPEC 1.002 (L) 11/19/2017 0223   PHURINE 5.0 11/19/2017 0223   GLUCOSEU >=500 (A) 11/19/2017 0223   HGBUR MODERATE (A) 11/19/2017 0223   HGBUR trace-intact 08/21/2006 0801   BILIRUBINUR NEGATIVE 11/19/2017 0223   KETONESUR NEGATIVE 11/19/2017 0223   PROTEINUR NEGATIVE 11/19/2017 0223   UROBILINOGEN 0.2 07/21/2012 1359   NITRITE NEGATIVE 11/19/2017 0223   LEUKOCYTESUR NEGATIVE 11/19/2017 0223   Urine Drug Screen     Component Value Date/Time   LABOPIA NONE DETECTED 2017-12-01 0745   COCAINSCRNUR NONE DETECTED 12/01/17 0745   LABBENZ NONE DETECTED 2017/12/01 0745   AMPHETMU NONE DETECTED 12/01/17 0745   THCU NONE DETECTED 12-01-17 0745   LABBARB NONE DETECTED 2017-12-01 0745    Alcohol Level    Component Value Date/Time   ETH <10 01-Dec-2017 0718     SIGNIFICANT DIAGNOSTIC STUDIES  ImagingResults  Ct Head Wo Contrast  Result  Date: Dec 01, 2017 CLINICAL DATA:  Altered mental status EXAM: CT HEAD WITHOUT CONTRAST TECHNIQUE: Contiguous axial images were obtained from the base of the skull through the vertex without intravenous contrast. COMPARISON:  None. FINDINGS: Brain: There is a 7.6 x 3.9 cm parenchymal hemorrhage with its epicenter in the left basal ganglia. This results in significant mass effect upon the adjacent brain parenchyma structures. There is 13  mm midline shift from left to right. There is some surrounding vasogenic edema. There is extension of hemorrhage into the ventricular system, including the bilateral lateral ventricles, third ventricle, and fourth ventricle. There is dilatation of the ventricular system and effacement of the sulci compatible with developing pressure in the ventricular system and possible cerebral edema. The suprasellar system is somewhat effaced. The top of the brainstem is somewhat distorted due to mass effect. See image 12 of series 3. The left uncus is pressed upon the brainstem worrisome for impending uncal herniation. Vascular: No hyperdense vessel or unexpected calcification. Skull: The cranium is intact. Sinuses/Orbits: There is mucosal thickening throughout the ethmoid air cells. Minimal polypoid mucosal thickening in the maxillary sinuses. Frontal sinuses and mastoid air cells are clear. Other: Endotracheal and OG tubes are in place. IMPRESSION: Large parenchymal hemorrhage in the left basal ganglia most consistent with a hypertensive hemorrhage. There is extension of the hemorrhage into the ventricular system with dilatation of the ventricular system consistent with hydrocephalus. There is 13 mm of midline shift from left to right. Critical Value/emergent results were called by telephone at the time of interpretation on 12/10/2017 at 7:51 am to Dr. Dione Booze , who verbally acknowledged these results. Electronically Signed   By: Jolaine Click M.D.   On: 2017-12-10 07:51   Dg Chest Port 1  View  Result Date: 12/10/17 CLINICAL DATA:  Endotracheal tube placement. EXAM: PORTABLE CHEST 1 VIEW COMPARISON:  None. FINDINGS: Endotracheal tube tip is above the sternal notch 9.5 cm above the carina. Enteric tube courses into the region of the stomach and off the inferior portion of the film as tip is not visualized. Lungs are adequately inflated with linear density over the right midlung likely atelectasis. There is minimal prominence of the perihilar markings suggesting mild vascular congestion. Mild cardiomegaly. Degenerative change of the spine. IMPRESSION: Mild cardiomegaly with suggestion mild vascular congestion. Linear density right midlung likely atelectasis. Infection in right perihilar region is possible. Endotracheal tube has tip 9.5 cm above the carina. Enteric tube courses into the region of the stomach and off the film as tip is not visualized. Electronically Signed   By: Elberta Fortis M.D.   On: Dec 10, 2017 07:19   Dg Abd Portable 1v  Result Date: Dec 10, 2017 CLINICAL DATA:  57 year old male status post orogastric tube placement. EXAM: PORTABLE ABDOMEN - 1 VIEW COMPARISON:  No priors. FINDINGS: Enteric tube with tip in the mid body of the stomach. Visualized bowel gas pattern is nonobstructive. Visualized portions of the lower thorax demonstrate a small right pleural effusion and low lung volumes. IMPRESSION: 1. Tip of enteric tube is in the mid body of the stomach. 2. Low lung volumes with small right pleural effusion. Electronically Signed   By: Trudie Reed M.D.   On: 12/10/17 14:52        HISTORY OF PRESENT ILLNESS  (From H&P by Dr Amada Jupiter 12-10-17)  RONAK DUQUETTE is a 57 y.o. male with a history of hypertension, hyperlipidemia, diabetes who presents with large parenchymal hemorrhage.  He was in his normal state of health last night, and was getting ready for work this morning, though his wife did not see him.  At 5:10, she heard him fall and went to check on  him and found him not moving his right side and try to move himself with the left side.  On arrival, he was seen to be having decerebrate posturing.  EMS brought him in emergently where he was intubated  and taken for head CT.  Head CT showed a large subcortical intraparenchymal hemorrhage with intraventricular extension.  I discussed his case with Dr. Johnsie Cancel of neurosurgery  LKW: Last night prior to bed tpa given?: no, ich ICH Score: 0 Modified Rankin Scale: 0-Completely asymptomatic and back to baseline post- stroke   HOSPITAL COURSE Mr. OCTAVIS SHEELER is a 57 y.o. male with history of HTN, HLD, DM, morbid obesity admitted for unresponsiveness. No tPA given due to large ICH and IVH.   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   NM brain perfusion study confirmed brain death  Obstructive hydrocephalus  CT showed obstructive hydrocephalus  NSG consulted not candidate for surgery  Was on 3% saline  brain death  Uncal herniation  Due to large ICH, IVH and obstructive hydrocephalus  No brainstem reflexes  Clinically brain death which was confirmed by NM brain flow study  Will stop 3% saline  Na 148->162->163  Hypotension hypertension  Was on cardene   BP on the low end with pressures  Continue pressor and maintain BP for organ donation   Diabetes insipidus  Na 148->162->163  On IVF   Consistent with severe brain injury / brain death  No further escalation of care now  NM brain flow study confirmed brain death  Diabetes  On home med of metformin, actos and latus  A1C 6.6  Currently hyperglycemia  Hyperlipidemia  Home meds:   Pravastatin  CKD  Cre 1.65->2.03->2.10  On IVF  Other Stroke Risk Factors  Morbid Obesity, Body mass index is 45.58 kg/m.   Other Active Problems  HIV negative   DISCHARGE EXAM Brain death.    Marvel Plan, MD PhD Stroke Neurology 11/20/2017 12:05 AM

## 2017-11-19 NOTE — Progress Notes (Signed)
ABG due for 0500. RT talked to donor services they would like to hold off on ABG until a little before 7.

## 2017-11-20 DIAGNOSIS — N289 Disorder of kidney and ureter, unspecified: Secondary | ICD-10-CM

## 2017-11-20 DIAGNOSIS — G911 Obstructive hydrocephalus: Secondary | ICD-10-CM

## 2017-11-20 DIAGNOSIS — I10 Essential (primary) hypertension: Secondary | ICD-10-CM

## 2017-11-20 DIAGNOSIS — I619 Nontraumatic intracerebral hemorrhage, unspecified: Secondary | ICD-10-CM

## 2017-11-20 DIAGNOSIS — I615 Nontraumatic intracerebral hemorrhage, intraventricular: Secondary | ICD-10-CM

## 2017-11-20 LAB — TYPE AND SCREEN
ABO/RH(D): A POS
Antibody Screen: NEGATIVE
PT AG Type: POSITIVE
UNIT DIVISION: 0
UNIT DIVISION: 0
Unit division: 0
Unit division: 0

## 2017-11-20 LAB — BPAM RBC
BLOOD PRODUCT EXPIRATION DATE: 201912052359
BLOOD PRODUCT EXPIRATION DATE: 201912062359
BLOOD PRODUCT EXPIRATION DATE: 201912062359
Blood Product Expiration Date: 201912062359
UNIT TYPE AND RH: 6200
Unit Type and Rh: 6200
Unit Type and Rh: 6200
Unit Type and Rh: 6200

## 2017-11-20 LAB — CALCIUM, IONIZED: CALCIUM, IONIZED, SERUM: 5 mg/dL (ref 4.5–5.6)

## 2017-11-21 LAB — CULTURE, RESPIRATORY W GRAM STAIN

## 2017-11-22 ENCOUNTER — Telehealth: Payer: Self-pay | Admitting: Family Medicine

## 2017-11-22 NOTE — Telephone Encounter (Signed)
I called the patient's widow to let her know that I was thinking about her.  I thanked her for taking the call.  She thanked me for calling.  I was always really glad to see this gentleman in the clinic.  He was a kind man and I will miss seeing him.  Please process the chart given recent events.  NM brain perfusion study confirmed brain death on 12/07/17.  Thanks.

## 2017-11-23 LAB — CULTURE, BLOOD (ROUTINE X 2)
CULTURE: NO GROWTH
CULTURE: NO GROWTH
Special Requests: ADEQUATE

## 2017-11-23 MED FILL — Vancomycin HCl For IV Soln 1 GM (Base Equivalent): INTRAVENOUS | Qty: 1000 | Status: AC

## 2017-11-23 MED FILL — Dextrose Inj 5%: INTRAVENOUS | Qty: 500 | Status: AC

## 2017-11-23 MED FILL — Vancomycin HCl For IV Soln 500 MG (Base Equivalent): INTRAVENOUS | Qty: 500 | Status: AC

## 2017-11-23 NOTE — Telephone Encounter (Signed)
Patient status updated to deceased. ?

## 2017-12-13 NOTE — Procedures (Addendum)
Central Venous Catheter Insertion Procedure Note Patrick Watts [id number] December 31, 1960  Procedure: Insertion of Central Venous Catheter Indications: Assessment of intravascular volume, Drug and/or fluid administration and Frequent blood sampling  Procedure Details Consent: Risks of procedure as well as the alternatives and risks of each were explained to the (patient/caregiver).  Consent for procedure obtained. Time Out: Verified patient identification, verified procedure, site/side was marked, verified correct patient position, special equipment/implants available, medications/allergies/relevent history reviewed, required imaging and test results available.  Performed  Maximum sterile technique was used including antiseptics, cap, gloves, gown, hand hygiene, mask and sheet. Skin prep: Chlorhexidine; local anesthetic administered A antimicrobial bonded/coated triple lumen catheter was placed in the left internal jugular vein using the Seldinger technique to 20 cm.  Line sutured.  Biopatch and sterile dressing applied.   Evaluation Blood flow good Complications: No apparent complications Patient did tolerate procedure well. Chest X-ray ordered to verify placement.  CXR: pending.  Procedure performed with ultrasound guidance for real time vessel cannulation.     Posey Boyer, AGACNP-BC [facility] Pgr: [phone] or if no answer [phone] 11/17/2017, 4:48 PM   Supervised, agree  Heber Brook, MD [place] [facility] Pager: [phone] Cell: [phone] After 3pm or if no response, call [phone]

## 2017-12-13 NOTE — Plan of Care (Addendum)
Related nuclear brain flow study result to family. Answered all their questions. Pt family including wife, daughter, son, brothers were all present and expressed understanding the confirmation of brain death. The time of death will be 1358.   Marvel Plan, MD PhD Stroke Neurology 2017-12-05 2:27 PM

## 2017-12-13 NOTE — Progress Notes (Addendum)
STROKE TEAM PROGRESS NOTE   SUBJECTIVE (INTERVAL HISTORY) His wife is at the bedside. Pt still intubated on vent. BP again dropped to 80s and increased neo dose. Yesterday when preparing for apnea test, pt seems triggering vent when decreased RR on vent. However, pt chest motion graph not flat line due to heart palpitation which likely triggered vent for breath. Therefore, trying to do apnea test would not appropriate in this case. Discussed with Dr. Kendrick Fries, we will do brain flow study for brain death determination.    OBJECTIVE Temp:  [96.3 F (35.7 C)-100.6 F (38.1 C)] 97.9 F (36.6 C) (11/06 0900) Pulse Rate:  [64-82] 81 (11/06 0900) Cardiac Rhythm: Normal sinus rhythm (11/06 0800) Resp:  [14-20] 15 (11/06 0900) BP: (92-118)/(34-54) 101/53 (11/06 0900) SpO2:  [90 %-99 %] 93 % (11/06 0900) FiO2 (%):  [50 %-70 %] 60 % (11/06 0750)  No results for input(s): GLUCAP in the last 168 hours. Recent Labs  Lab 11/27/2017 0718 2017-11-27 1231 11-27-17 2028 11/17/17 0349 11/20/2017 0230 12/12/2017 0536  NA 140 141 141 148* 162* 163*  K 3.6  --   --   --  4.2 4.0  CL 110  --   --   --  >130* >130*  CO2 23  --   --   --  23 23  GLUCOSE 167*  --   --   --  212* 194*  BUN 14  --   --   --  23* 24*  CREATININE 1.65*  --   --   --  2.03* 2.10*  CALCIUM 8.8*  --   --   --  8.6* 8.6*   Recent Labs  Lab 2017/11/27 0718  AST 28  ALT 25  ALKPHOS 89  BILITOT 0.8  PROT 8.0  ALBUMIN 3.4*   Recent Labs  Lab Nov 27, 2017 0718  WBC 9.5  NEUTROABS 6.4  HGB 14.9  HCT 47.4  MCV 93.9  PLT 273   Recent Labs  Lab 11-27-17 0718  TROPONINI <0.03   No results for input(s): LABPROT, INR in the last 72 hours. Recent Labs    2017-11-27 0745  COLORURINE YELLOW  LABSPEC 1.012  PHURINE 5.0  GLUCOSEU 50*  HGBUR MODERATE*  BILIRUBINUR NEGATIVE  KETONESUR NEGATIVE  PROTEINUR 100*  NITRITE NEGATIVE  LEUKOCYTESUR NEGATIVE       Component Value Date/Time   CHOL 137 06/23/2017 1542   TRIG 83.0  06/23/2017 1542   HDL 35.00 (L) 06/23/2017 1542   CHOLHDL 4 06/23/2017 1542   VLDL 16.6 06/23/2017 1542   LDLCALC 86 06/23/2017 1542   Lab Results  Component Value Date   HGBA1C 6.6 (H) 06/23/2017      Component Value Date/Time   LABOPIA NONE DETECTED 11/27/2017 0745   COCAINSCRNUR NONE DETECTED 11-27-17 0745   LABBENZ NONE DETECTED 11/27/2017 0745   AMPHETMU NONE DETECTED Nov 27, 2017 0745   THCU NONE DETECTED Nov 27, 2017 0745   LABBARB NONE DETECTED 2017-11-27 0745    Recent Labs  Lab 2017/11/27 0718  ETH <10    I have personally reviewed the radiological images below and agree with the radiology interpretations.  Ct Head Wo Contrast  Result Date: 27-Nov-2017 CLINICAL DATA:  Altered mental status EXAM: CT HEAD WITHOUT CONTRAST TECHNIQUE: Contiguous axial images were obtained from the base of the skull through the vertex without intravenous contrast. COMPARISON:  None. FINDINGS: Brain: There is a 7.6 x 3.9 cm parenchymal hemorrhage with its epicenter in the left basal ganglia. This results in  significant mass effect upon the adjacent brain parenchyma structures. There is 13 mm midline shift from left to right. There is some surrounding vasogenic edema. There is extension of hemorrhage into the ventricular system, including the bilateral lateral ventricles, third ventricle, and fourth ventricle. There is dilatation of the ventricular system and effacement of the sulci compatible with developing pressure in the ventricular system and possible cerebral edema. The suprasellar system is somewhat effaced. The top of the brainstem is somewhat distorted due to mass effect. See image 12 of series 3. The left uncus is pressed upon the brainstem worrisome for impending uncal herniation. Vascular: No hyperdense vessel or unexpected calcification. Skull: The cranium is intact. Sinuses/Orbits: There is mucosal thickening throughout the ethmoid air cells. Minimal polypoid mucosal thickening in the  maxillary sinuses. Frontal sinuses and mastoid air cells are clear. Other: Endotracheal and OG tubes are in place. IMPRESSION: Large parenchymal hemorrhage in the left basal ganglia most consistent with a hypertensive hemorrhage. There is extension of the hemorrhage into the ventricular system with dilatation of the ventricular system consistent with hydrocephalus. There is 13 mm of midline shift from left to right. Critical Value/emergent results were called by telephone at the time of interpretation on 12/12/2017 at 7:51 am to Dr. Dione Booze , who verbally acknowledged these results. Electronically Signed   By: Jolaine Click M.D.   On: 12/05/2017 07:51   Dg Chest Port 1 View  Result Date: 11/13/2017 CLINICAL DATA:  Endotracheal tube placement. EXAM: PORTABLE CHEST 1 VIEW COMPARISON:  None. FINDINGS: Endotracheal tube tip is above the sternal notch 9.5 cm above the carina. Enteric tube courses into the region of the stomach and off the inferior portion of the film as tip is not visualized. Lungs are adequately inflated with linear density over the right midlung likely atelectasis. There is minimal prominence of the perihilar markings suggesting mild vascular congestion. Mild cardiomegaly. Degenerative change of the spine. IMPRESSION: Mild cardiomegaly with suggestion mild vascular congestion. Linear density right midlung likely atelectasis. Infection in right perihilar region is possible. Endotracheal tube has tip 9.5 cm above the carina. Enteric tube courses into the region of the stomach and off the film as tip is not visualized. Electronically Signed   By: Elberta Fortis M.D.   On: 12/01/2017 07:19   Dg Abd Portable 1v  Result Date: 11/28/2017 CLINICAL DATA:  57 year old male status post orogastric tube placement. EXAM: PORTABLE ABDOMEN - 1 VIEW COMPARISON:  No priors. FINDINGS: Enteric tube with tip in the mid body of the stomach. Visualized bowel gas pattern is nonobstructive. Visualized portions of the  lower thorax demonstrate a small right pleural effusion and low lung volumes. IMPRESSION: 1. Tip of enteric tube is in the mid body of the stomach. 2. Low lung volumes with small right pleural effusion. Electronically Signed   By: Trudie Reed M.D.   On: 12/10/2017 14:52    PHYSICAL EXAM  Temp:  [96.3 F (35.7 C)-100.6 F (38.1 C)] 97.9 F (36.6 C) (11/06 0900) Pulse Rate:  [64-82] 81 (11/06 0900) Resp:  [14-20] 15 (11/06 0900) BP: (92-118)/(34-54) 101/53 (11/06 0900) SpO2:  [90 %-99 %] 93 % (11/06 0900) FiO2 (%):  [50 %-70 %] 60 % (11/06 0750)  General - morbid obesity, well developed, intubated.  Ophthalmologic - fundi not visualized due to noncooperation.  Cardiovascular - Regular rate and rhythm.  Neuro - intubated, not on sedation, not response to pain or voice. Not breathing over the vent. Pupils bilaterally 6mm, fixed, no  papillary reflex, no corneal or gag or cough. Cold caloric testing negative, no doll's eyes. On pain stimulation, no response in all 4 limbs. DTR diminished, however, very weak babinski bilaterally, which only indicate weak spinal cord function but not higher cortical function.   ASSESSMENT/PLAN Patrick Watts is a 57 y.o. male with history of HTN, HLD, DM, morbid obesity admitted for unresponsiveness. No tPA given due to large ICH and IVH.    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   Disposition: I had long discussion with wife and other family members at bedside, updated pt current condition, and clinical breath death. Not able to do apnea test at bedside given heat palpitation triggering vent, but will do blood flow study to determine brain death.    Obstructive hydrocephalus  CT showed obstructive hydrocephalus  NSG consulted not candidate for surgery  Was on 3% saline  Currently clinically brain death  Discussed with wife and she is agree with terminal extubation after family visit.  Uncal herniation  Due to large ICH, IVH and obstructive hydrocephalus  No brainstem reflexes  Clinically brain death  Will stop 3% saline  Na 148->162->163  Discussed with wife and she is agree with terminal extubation after family visit.  Hypotension hypertension  Was on cardene for HTN overnight  BP 80s this am again  cardene has been discontinued  Continue neo and increase dose  Try to hold on pressor and maintain BP before terminal extubation  Diabetes insipidus  Na 148->162->163  On IVF   Consistent with severe brain injury / brain death  No further escalation of care now  Will do brain flow study  Diabetes  On home med of metformin, actos and latus  A1C 6.6  Currently hyperglycemia  Hyperlipidemia  Home meds:  Pravastatin  CKD  Cre 1.65->2.03->2.10  On IVF  Other Stroke Risk Factors  Morbid Obesity, Body mass index is 45.58 kg/m.   Other Active Problems  HIV negative   Hospital day # 2  This patient is critically ill due to large ICH IVH and hydrocephalus and at significant risk of neurological worsening, death form brain herniation and brain death. This patient's care requires constant monitoring of vital signs, hemodynamics, respiratory and cardiac monitoring, review of multiple databases, neurological assessment, discussion with family, other specialists and medical decision making of high complexity. I spent 40 minutes of neurocritical care time in the care of this patient.  I had long discussion with wife and other family members at bedside, updated pt current condition, and clinical breath death. Family agrees with blood flow study for brain death determination. I also discussed with  Dr. Kendrick Fries extensively in the hallway.    Marvel Plan, MD PhD Stroke Neurology December 03, 2017 10:13 AM    To contact Stroke Continuity provider, please refer to [email or web address]. After hours, contact General Neurology

## 2017-12-13 NOTE — Progress Notes (Signed)
I completed a follow up visit with the patient and his family. I visited the patient's room with the patient's wife, brother, and sister-in-law present. I shared words of encouragement and led in prayer. I shared that the Chaplain is available for additional support as needed or requested.     12/04/2017 0900  Clinical Encounter Type  Visited With Patient and family together  Visit Type Follow-up;Spiritual support  Spiritual Encounters  Spiritual Needs Prayer;Emotional    Chaplain Dr Melvyn Novas

## 2017-12-13 NOTE — Procedures (Signed)
Arterial Catheter Insertion Procedure Note Patrick Watts [id number] 07/18/1960  Procedure: Insertion of Arterial Catheter  Indications: Blood pressure monitoring and Frequent blood sampling  Procedure Details Consent: Risks of procedure as well as the alternatives and risks of each were explained to the (patient/caregiver).  Consent for procedure obtained. Time Out: Verified patient identification, verified procedure, site/side was marked, verified correct patient position, special equipment/implants available, medications/allergies/relevent history reviewed, required imaging and test results available.  Performed  Maximum sterile technique was used including antiseptics, cap, gloves, gown, hand hygiene, mask and sheet. Skin prep: Chlorhexidine; local anesthetic administered 20 gauge catheter was inserted into left radial artery using the Seldinger technique. ULTRASOUND GUIDANCE USED: NO Evaluation Blood flow good; BP tracing good. Complications: No apparent complications.   Durwin Glaze 12/06/2017

## 2017-12-13 NOTE — Progress Notes (Signed)
Post bronchoscopy ABG obtained after advancing ETT and giving patient 2 amps of bicarb.  Results given to donor services.  RN to give 2 more amps of bicarb and will obtain another ABG after administration.  Will continue to monitor.    Ref. Range 12/03/17 17:57  Sample type Unknown ARTERIAL  pH, Arterial Latest Ref Range: 7.350 - 7.450  7.152 (LL)  pCO2 arterial Latest Ref Range: 32.0 - 48.0 mmHg 32.5  pO2, Arterial Latest Ref Range: 83.0 - 108.0 mmHg 73.0 (L)  TCO2 Latest Ref Range: 22 - 32 mmol/L 13 (L)  Acid-base deficit Latest Ref Range: 0.0 - 2.0 mmol/L 16.0 (H)  Bicarbonate Latest Ref Range: 20.0 - 28.0 mmol/L 11.6 (L)  O2 Saturation Latest Units: % 91.0  Patient temperature Unknown 35.5 C  Collection site Unknown ARTERIAL LINE

## 2017-12-13 NOTE — Progress Notes (Signed)
Received pt already in Nuc Med on vent from unit RT, I transported back to ICU s/p test w/ no apparent complications during transport.  Once back in room, noted very small cuff leak w/ volume loss approx 30-40 ml of air.  ETT was noted at 22 at lip, placed ETT at 23 at lip per previous charting.  Checked ETT cuff pressure already reading approx 60-70.  Added another 1.5 ml of air, cuff pressure now 80.  Per RN and student RT this also happened earlier today.   Sat 90-91% on 100% fio2 s/p transport.  Pt remains on 100% for now d/t Sat.    Unit RT called and reported all of above.  RN in room and aware.

## 2017-12-13 NOTE — Progress Notes (Signed)
PCCM Video Bronchoscopy Procedure Note  The patient was informed of the risks (including but not limited to bleeding, infection, respiratory failure, lung injury, tooth/oral injury) and benefits of the procedure and gave consent, see chart.  Indication: airway inspection for potential organ donor  Post Procedure Diagnosis: tracheobronchitis  Location: [facility] Neuro ICU  Condition pre procedure: Critically ill, on vent  Medications for procedure: none  Procedure description: The bronchoscope was introduced through the endotracheal tube and passed to the bilateral lungs to the level of the subsegmental bronchi throughout the tracheobronchial tree.  Airway exam revealed that the endotracheal tube was located just outside the trachea at the level of the larynx.  The tube was re-positioned under bronchoscopic guidance and then the remainder of the procedure was performed.  There were copious, thick beige secretions throughout the airways, occluding bronchus intermedius, lingula and left lower lobe.  There were no airway lesions seen after suctioning but examination was limited by thick secretions.  Procedures performed: therapeutic aspiration of the RLL  Specimens sent: bronch aspirate for culture  Condition post procedure: critically ill, on vent  EBL: none from procedure  Complications: none  Heber Antelope, MD [place] [facility] Pager: [phone] Cell: [phone] After 3pm or if no response, call [phone]

## 2017-12-13 NOTE — Progress Notes (Signed)
10/5 2325: pt hypothermic bear hugger applied per protocol. MD contacted about possible DI. Changes of care include NS 125 ml/hr for maintenance fluid and Q6 BMP to monitor sodium.

## 2017-12-13 NOTE — Progress Notes (Signed)
ABG was obtained at 1700 on ventilator settings of tidal volume of 520, respiratory rate of 22, FIO2 of 100%, and PEEP of 8.  Showed results to donor services and RN gave to amps of bicarb.  Bronchoscopy also performed by MD and ETT was advanced to 29 at the lip.  Tolerated well.    Ref. Range 11/20/2017 17:00  Sample type Unknown ARTERIAL  pH, Arterial Latest Ref Range: 7.350 - 7.450  7.110 (LL)  pCO2 arterial Latest Ref Range: 32.0 - 48.0 mmHg 53.4 (H)  pO2, Arterial Latest Ref Range: 83.0 - 108.0 mmHg 49.0 (L)  TCO2 Latest Ref Range: 22 - 32 mmol/L 19 (L)  Acid-base deficit Latest Ref Range: 0.0 - 2.0 mmol/L 12.0 (H)  Bicarbonate Latest Ref Range: 20.0 - 28.0 mmol/L 17.3 (L)  O2 Saturation Latest Units: % 74.0  Patient temperature Unknown 35.6 C  Collection site Unknown ARTERIAL LINE

## 2017-12-13 NOTE — Progress Notes (Signed)
PULMONARY / CRITICAL CARE MEDICINE   NAME:  Patrick Watts, MRN:  [id number], DOB:  [birth year], LOS: 2 ADMISSION DATE:  11-29-2017, CONSULTATION DATE:  11/17/17 REFERRING MD:  Roda Shutters, CHIEF COMPLAINT:  stroke  BRIEF HISTORY:    57 y/o male admitted with unresponsiveness, found to have a very large intracerebral hemorrhage.    SIGNIFICANT PAST MEDICAL HISTORY   Hypertension, hyperlipidemia, CKD  SIGNIFICANT EVENTS:  11/4 admit to hospital  STUDIES:   11/4 CT head > large parenchymal hemorrhage in left basal ganglia most consistent with hypertensive hemorrhage with extension into ventricular system consistent with hydrocephalus, 13 mm midline shift from left to right  CULTURES:    ANTIBIOTICS:    LINES/TUBES:  11/4 ETT >    CONSULTANTS:    SUBJECTIVE:  Moved to ICU overnight No activity seen on physical exam  CONSTITUTIONAL: BP (!) 103/47 (BP Location: Right Arm)   Pulse 81   Temp 98.2 F (36.8 C) (Core)   Resp 15   Ht 5\' 7"  (1.702 m)   Wt 132 kg   SpO2 94%   BMI 45.58 kg/m   I/O last 3 completed shifts: In: 7694 [I.V.:7694] Out: 7150 [Urine:6450; Emesis/NG output:700]     Vent Mode: PRVC FiO2 (%):  [50 %-70 %] 70 % Set Rate:  [15 bmp] 15 bmp Vt Set:  [520 mL] 520 mL PEEP:  [5 cmH20] 5 cmH20 Plateau Pressure:  [18 cmH20-22 cmH20] 22 cmH20  PHYSICAL EXAM:  General:  In bed on vent HENT: NCAT ETT in place PULM: CTA B, vent supported breathing CV: RRR, no mgr GI: BS+, soft, nontender MSK: normal bulk and tone Neuro: GCS 3    RESOLVED PROBLEM LIST   ASSESSMENT AND PLAN   Acute respiratory failure with hypoxemia: no clear respiratory effort on my exam; interestingly his heartbeat will cause the mechanical ventilator to trigger a breath if the device is set to a flow trigger; however if changed to a pressure trigger it will not deliver a breath; on physical exam he exhibits no signs of breathing.  So I believe that he will fail an apnea test.  However  because of confusion over this test from the family, I think it is best to continue full mechanical ventilation for now and proceed with a cerebral perfusion study.  DI: > would not advance further care  Severe acute brain injury due to large intracerebral stroke: physical exam consistent with brain death; plan cerebral perfusion study today.  Discussed with neurology  Advised family that considering prognosis would not advise more lines, labs, medications, they voiced understanding.   SUMMARY OF TODAY'S PLAN:  Read above  Best Practice / Goals of Care / Disposition.   DVT PROPHYLAXIS:scd SUP: add famotidine NUTRITION: don't add given prognosis MOBILITY: n/a GOALS OF CARE: would not escalate care, awaiting cerebral perfusion study FAMILY DISCUSSIONS: updated family at length today DISPOSITION remain in ICU  LABS  Glucose No results for input(s): GLUCAP in the last 168 hours.  BMET Recent Labs  Lab November 29, 2017 0718  11/17/17 0349 12/08/2017 0230 12/12/2017 0536  NA 140   < > 148* 162* 163*  K 3.6  --   --  4.2 4.0  CL 110  --   --  >130* >130*  CO2 23  --   --  23 23  BUN 14  --   --  23* 24*  CREATININE 1.65*  --   --  2.03* 2.10*  GLUCOSE 167*  --   --  212* 194*   < > = values in this interval not displayed.    Liver Enzymes Recent Labs  Lab 11/15/2017 0718  AST 28  ALT 25  ALKPHOS 89  BILITOT 0.8  ALBUMIN 3.4*    Electrolytes Recent Labs  Lab 12/01/2017 0718 2017/11/28 0230 11/28/17 0536  CALCIUM 8.8* 8.6* 8.6*    CBC Recent Labs  Lab 12/12/2017 0718  WBC 9.5  HGB 14.9  HCT 47.4  PLT 273    ABG Recent Labs  Lab 12/02/2017 0907 11/17/17 1443  PHART 7.355 7.302*  PCO2ART 35.2 41.4  PO2ART 74.0* 87.0    Coag's No results for input(s): APTT, INR in the last 168 hours.  Sepsis Markers No results for input(s): LATICACIDVEN, PROCALCITON, O2SATVEN in the last 168 hours.  Cardiac Enzymes Recent Labs  Lab 12/12/2017 0718  TROPONINI <0.03    My cc  time updating family and reviewing situation and discussing with other providers: 60 minutes   Heber Chumuckla, MD [place] [facility] Pager: [phone] Cell: [phone] After 3pm or if no response, call [phone]

## 2017-12-13 DEATH — deceased

## 2019-09-18 IMAGING — DX DG CHEST 1V PORT
1 series · 1 of 1 positions shown · non-contrast
Comparison: 11/16/2017.

CLINICAL DATA: Intubation.

EXAM:
PORTABLE CHEST 1 VIEW

[chest]
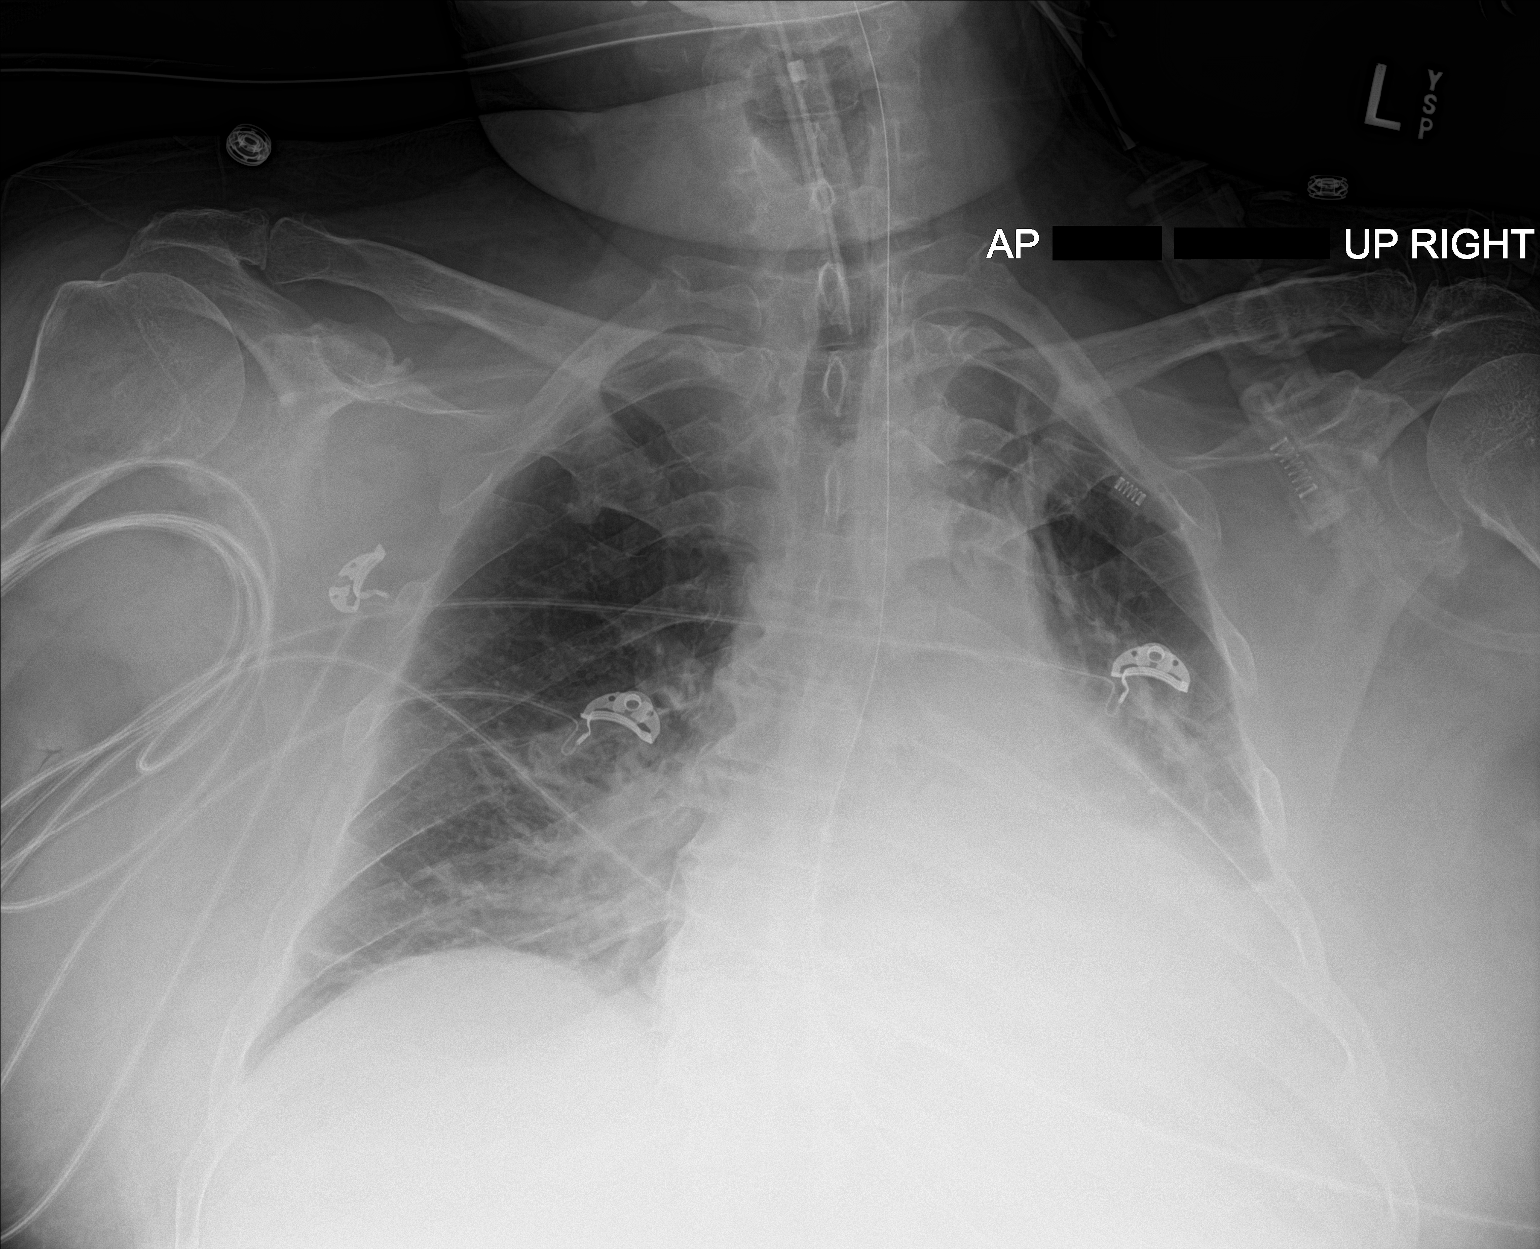

[1 of 1 positions shown; findings below may reference images not displayed]

FINDINGS: Endotracheal tube tip in unchanged position with tip at the sternal
notch 9.5 cm above the carina. Repositioning should be considered.
NG tube noted with tip below left hemidiaphragm. Cardiomegaly. No
pulmonary venous congestion. Dense bibasilar atelectasis/infiltrates
and left-sided pleural effusion. Pneumothorax.
IMPRESSION: 1. Tracheal tube tip in unchanged position with its tip at the
sternal notch 9.5 cm above the carina. Repositioning should be
considered. NG tube in stable position.

2. Dense bibasilar atelectasis/infiltrates and left-sided pleural
effusion.

## 2019-09-18 IMAGING — NM NM BRAIN 4+V W/ FLOW
4 series · 9 of 9 positions shown · non-contrast
Comparison: Head CT 11/16/2017

CLINICAL DATA: Massive intracranial hemorrhage. Evaluate for brain
death.

EXAM:
NM BRAIN SCAN WITH FLOW - 4+ VIEW
TECHNIQUE: Radionuclide angiogram and static images of the brain were obtained
after intravenous injection of radiopharmaceutical.
RADIOPHARMACEUTICALS:  20.5 millicuries technetium Ceretec

[br brain · 4.52mm/px · 6 of 60 frames shown (1 of 4)]
[frame 6/60]
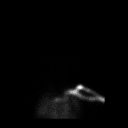
[frame 16/60]
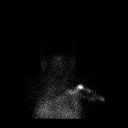
[frame 26/60]
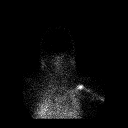
[frame 36/60]
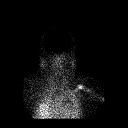
[frame 46/60]
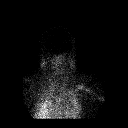
[frame 56/60]
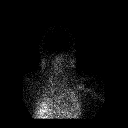

[br brain · 2.26mm/px · 1 of 1 slices shown (2 of 4)]
[im 1/1]
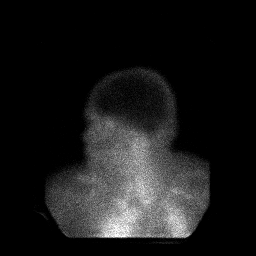

[br brain · 2.26mm/px · 1 of 1 slices shown (3 of 4)]
[im 1/1]
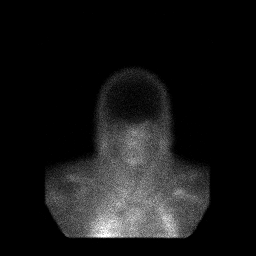

[br brain · 2.26mm/px · 1 of 1 slices shown (4 of 4)]
[im 1/1]
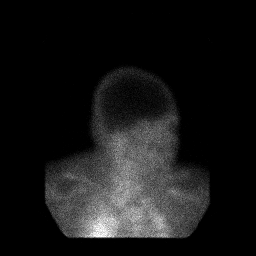

[9 of 9 positions shown; findings below may reference images not displayed]

FINDINGS: No intracerebral activity is identified consistent with brain death.
IMPRESSION: No intracranial blood flow or activity is demonstrated.
